# Patient Record
Sex: Male | Born: 1990 | Race: Black or African American | Hispanic: No | Marital: Single | Smoking: Current every day smoker
Health system: Southern US, Community
[De-identification: ages and names within clinical notes are randomized; demographics above are authoritative.]

## PROBLEM LIST (undated history)

## (undated) DIAGNOSIS — F209 Schizophrenia, unspecified: Secondary | ICD-10-CM

## (undated) HISTORY — DX: Schizophrenia, unspecified: F20.9

## (undated) HISTORY — PX: HYPOSPADIAS CORRECTION: SHX483

---
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
## 2010-05-21 NOTE — Assessment & Plan Note (Signed)
Summary: swollen gland in groin area,tcb   Vital Signs:  Patient profile:   20 year old male Height:      68.25 inches Weight:      135.7 pounds BMI:     20.56 Temp:     98.4 degrees F oral Pulse rate:   77 / minute BP sitting:   114 / 68  (left arm) Cuff size:   regular  Vitals Entered By: Gladstone Pih (May 21, 2009 4:12 PM) CC: C/O swollen in Groin area Is Patient Diabetic? No Pain Assessment Patient in pain? no        Primary Care Provider:  Lequita Asal  MD  CC:  C/O swollen in Groin area.  History of Present Illness: Right inguinal node swelling.  Concern is because he has had previous STD and because last fall he ad a "lymph node" removed from neck.  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  Otherwise feels fine.  No weight loss, night sweats, fevers or constitutional symptoms.  Also denies skin or GU symptoms    Smoker  Habits & Providers  Alcohol-Tobacco-Diet     Tobacco Status: current     Tobacco Counseling: to quit use of tobacco products     Cigarette Packs/Day: 0.5  Current Medications (verified): 1)  None  Allergies (verified): No Known Drug Allergies  Past History:  Past medical, surgical, family and social histories (including risk factors) reviewed, and no changes noted (except as noted below).  Past Medical History: Reviewed history from 06/16/2006 and no changes required. s/p hypospadias repair 8/92  Past Surgical History: Reviewed history from 06/28/2007 and no changes required. hypospadias repair  Family History: Reviewed history from 06/28/2007 and no changes required. brother with ADHD Mom and dad healthy  Social History: Reviewed history from 12/16/2008 and no changes required. Lives at home with mother and brother Rosana Fret (age 15).   A and B student. Likes working on cars and wants to be a Curator.  Also  plays basketball for fun. Smokes 1-2 cigarettes/day Intermittenty sexually active, always uses condoms No alcohol or illicit drug use Total sexual partners are 3-4.  Has sex with only females. Smoking Status:  current Packs/Day:  0.5  Physical Exam  General:  Well-developed,well-nourished,in no acute distress; alert,appropriate and cooperative throughout examination Genitalia:  Small rubbery 2-3 nodes in Rt groin.  Also has smaller nodes in left inguinal region   Impression & Recommendations:  Problem # 1:  LYMPHADENOPATHY (ICD-785.6) Doubt significant Orders: Comp Met-[facility] (16109-60454) CBC-[facility] (09811) [facility]- Est Level  3 (99213)  Problem # 2:  CONTACT WITH OR EXPOSURE TO VENEREAL DISEASES (ICD-V01.6) Recheck HIV and RPR Orders: RPR-[facility] (91478-29562) HIV-[facility] (13086-57846) [facility]- Est Level  3 (96295)  Problem # 3:  TOBACCO USER (ICD-305.1) Set quit date for next Monday. Orders: [facility]- Est Level  3 (28413)

## 2010-05-21 NOTE — Miscellaneous (Signed)
  Clinical Lists Changes  Problems: Removed problem of CONTACT WITH OR EXPOSURE TO VENEREAL DISEASES (ICD-V01.6) Removed problem of LYMPHADENOPATHY (ICD-785.6) Removed problem of Question of  DRUG ABUSE (ICD-305.90) Removed problem of GENITOURINARY INFECTION, CHLAMYDIA TRACHOMATIS (ICD-099.55)

## 2010-07-13 LAB — GC/CHLAMYDIA PROBE AMP, GENITAL
Chlamydia, DNA Probe: NEGATIVE
GC Probe Amp, Genital: NEGATIVE

## 2010-10-01 ENCOUNTER — Encounter: Payer: Self-pay | Admitting: Family Medicine

## 2010-10-01 ENCOUNTER — Ambulatory Visit (INDEPENDENT_AMBULATORY_CARE_PROVIDER_SITE_OTHER): Payer: Medicaid Other | Admitting: Family Medicine

## 2010-10-01 VITALS — BP 97/58 | HR 60 | Temp 98.8°F | Ht 68.75 in | Wt 147.0 lb

## 2010-10-01 DIAGNOSIS — F29 Unspecified psychosis not due to a substance or known physiological condition: Secondary | ICD-10-CM

## 2010-10-01 DIAGNOSIS — F209 Schizophrenia, unspecified: Secondary | ICD-10-CM

## 2010-10-01 NOTE — Patient Instructions (Signed)
Will draw fasting blood work. Make appointment for lab draw Nice to meet you

## 2010-10-01 NOTE — Progress Notes (Signed)
  Subjective:    Patient ID: Thomas Murphy, male    DOB: Dec 04, 1990, 20 y.o.   MRN: [id number]  HPI  Patient here to discuss workup for psychosis.  Brings written request from [place] from Manuela Neptune, NP and Carolanne Grumbling MD.  [address] [place] [phone] Fax [phone].  Prompted by recent diagnosis of psychosis.  Par mom, has noticed for past 3 years increasing bizarre behavior.  Laughing to himself, hearing things that arent around.  Received  a diagnosis of schizophrenia in May 2012 from [facility].  No recent SI, HI.  Still having some auditory hallucinations.  No sadness.  Is currently on Abilify.  Has follow-up appt in several weeks.  Continues to not be able to function independently, living with mother.   Completed part of a semester in college, not abel to hold down a job due to delusions.  I have reviewed patient's  PMH, FH, and Social history and Medications as related to this visit.   Review of Systems  Constitutional: Negative for fever and chills.  HENT: Negative for trouble swallowing.   Respiratory: Negative for cough and shortness of breath.   Cardiovascular: Negative for chest pain.  Gastrointestinal: Negative for nausea, abdominal pain, diarrhea and constipation.  Genitourinary: Negative for dysuria, discharge, penile swelling and genital sores.  Musculoskeletal: Negative for joint swelling.  Skin: Negative for rash.  Neurological: Negative for dizziness, weakness, numbness and headaches.  Psychiatric/Behavioral: Positive for hallucinations, behavioral problems and confusion. Negative for suicidal ideas and sleep disturbance. The patient is nervous/anxious.        Objective:   Physical Exam  Constitutional: He is oriented to person, place, and time. He appears well-developed and well-nourished. No distress.  HENT:  Head: Normocephalic and atraumatic.  Right Ear: External ear normal.  Left Ear: External ear normal.  Eyes: Conjunctivae and EOM are normal.  Pupils are equal, round, and reactive to light.  Neck: Neck supple. No thyromegaly present.  Cardiovascular: Normal rate and regular rhythm.   No murmur heard. Pulmonary/Chest: Effort normal and breath sounds normal. No respiratory distress. He has no wheezes.  Abdominal: Soft. Bowel sounds are normal. He exhibits no distension and no mass. There is no tenderness. There is no rebound and no guarding.  Musculoskeletal: He exhibits no edema.  Neurological: He is alert and oriented to person, place, and time. He has normal strength and normal reflexes. He displays no tremor and normal reflexes. No cranial nerve deficit. He exhibits normal muscle tone. He displays a negative Romberg sign. Coordination and gait normal.  Psychiatric: His affect is blunt. His speech is delayed. Hallucinating: unclear if responding to inernal stimuli. He expresses no suicidal plans and no homicidal plans.          Assessment & Plan:

## 2010-10-02 DIAGNOSIS — F209 Schizophrenia, unspecified: Secondary | ICD-10-CM | POA: Insufficient documentation

## 2010-10-02 DIAGNOSIS — F29 Unspecified psychosis not due to a substance or known physiological condition: Secondary | ICD-10-CM | POA: Insufficient documentation

## 2010-10-02 NOTE — Assessment & Plan Note (Addendum)
Psychiatry requests workup for psychosis.  Given several years in duration of abnormal behavior  with recent diagnosis of schizophrenia and a normal neuro exam unlikely to be a CNS or metabolic/ endocrine abnormality.  Will obtain TSH, CBC, CMET, U/A, UDS, Fasting labs (on Abilify)  If normal, no further workup needed.  Will follow-up annually or prn.

## 2010-10-02 NOTE — Assessment & Plan Note (Signed)
On Abilify.  Patient reports not having had any baseline labs.  Will draw FLP, fasting glucose, CMET, CBC.  Monitor weight.

## 2010-10-05 ENCOUNTER — Other Ambulatory Visit: Payer: Medicaid Other

## 2010-10-07 IMAGING — CT CT NECK W/ CM
3 of 4 series · 16 of 33 positions shown, 19 images · IV contrast (75CC OMNI 300)
Comparison: None

CLINICAL DATA: [DATE] years persistent left submandibular mass.

CT NECK WITH CONTRAST
TECHNIQUE: Multidetector CT imaging of the neck was performed with
intravenous contrast.
Contrast: 75 ml intravenous Nmnipaque-WNN.

[Series 3: soft tissue neck · axial · 0.49mm/px · z∈[-162,+48]mm · 8 of 72 slices shown, 10 images]
[im 8/72  soft-tissue]
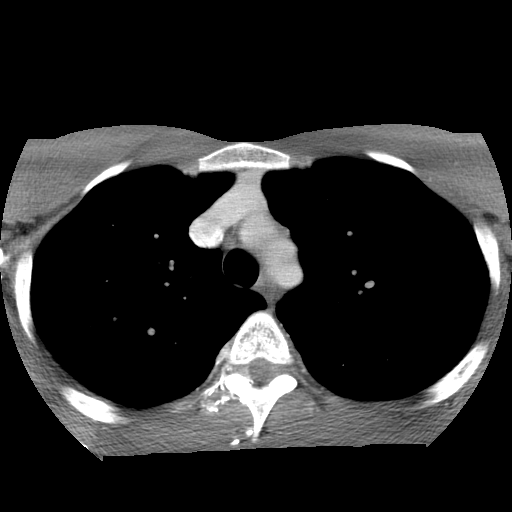
[im 8/72  bone]
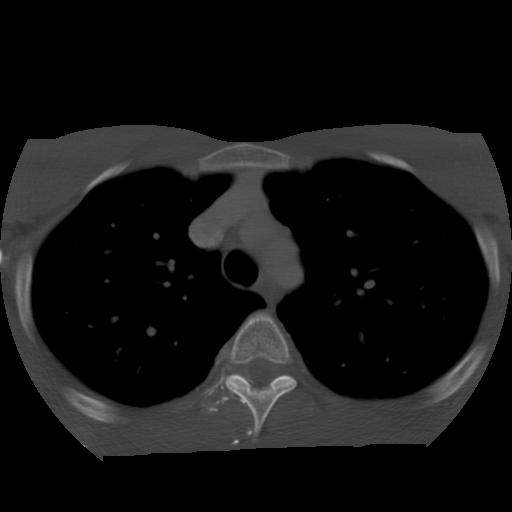
[im 15/72  bone]
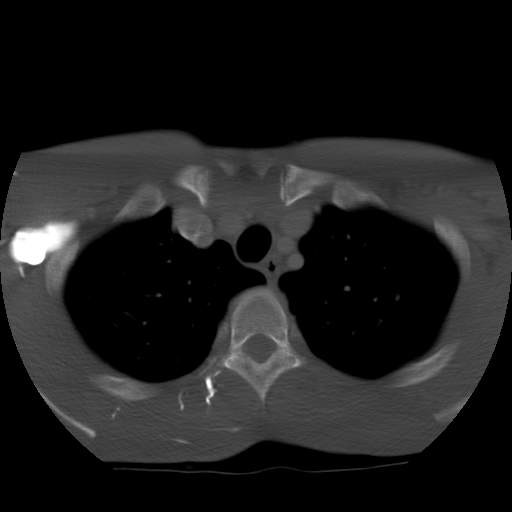
[im 22/72  bone]
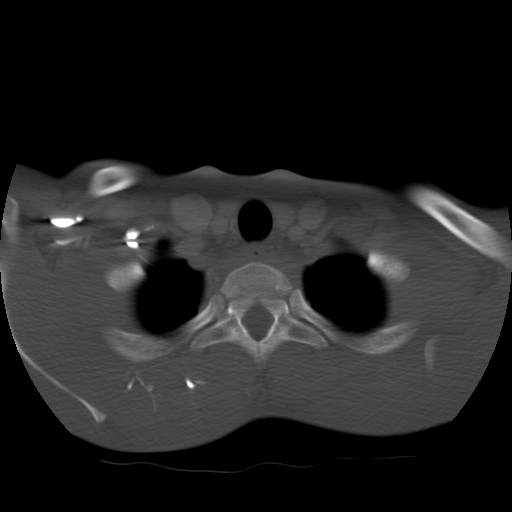
[im 29/72  bone]
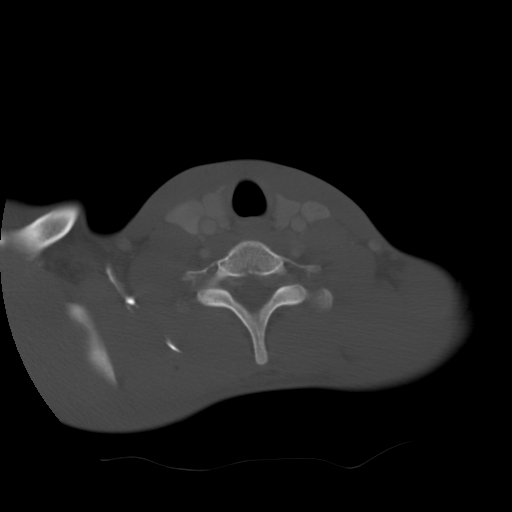
[im 43/72  soft-tissue]
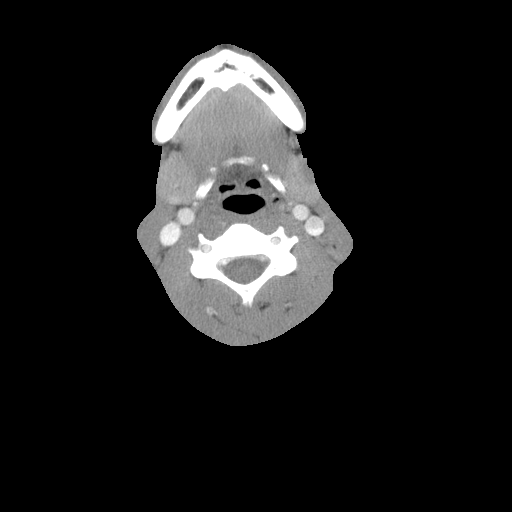
[im 43/72  bone]
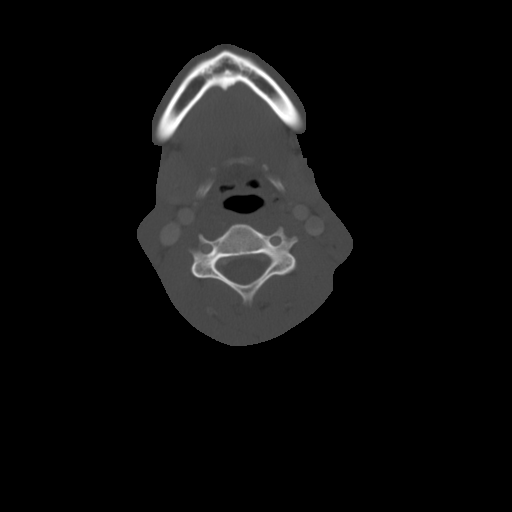
[im 50/72  bone]
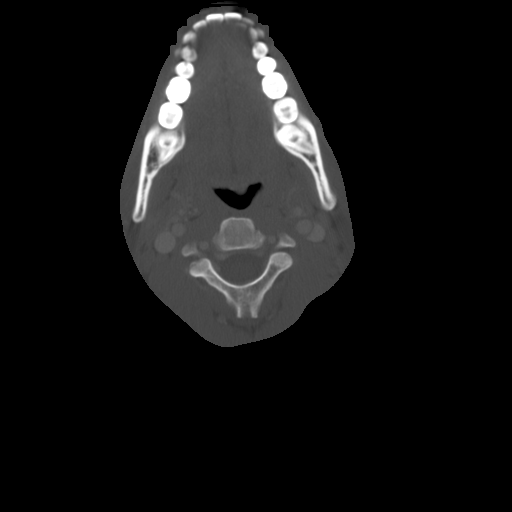
[im 57/72  bone]
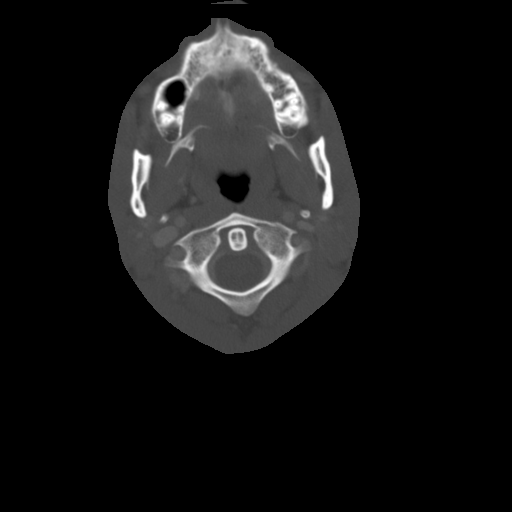
[im 64/72  bone]
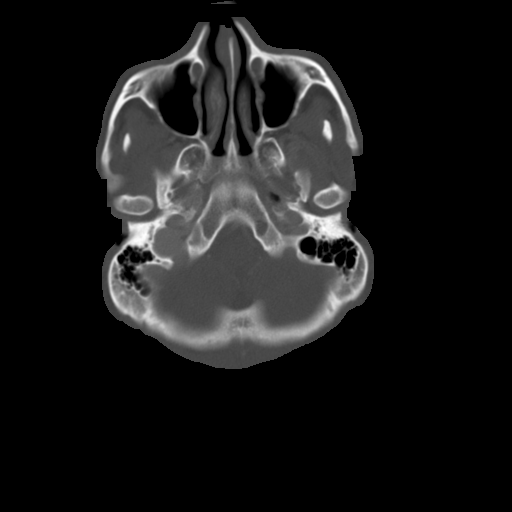

[Series 200: sag · sagittal · 0.53mm/px · 5 of 86 slices shown, 6 images]
[im 29/86  bone]
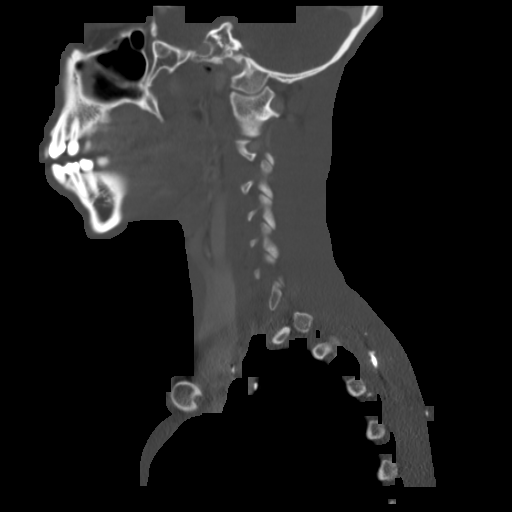
[im 36/86  bone]
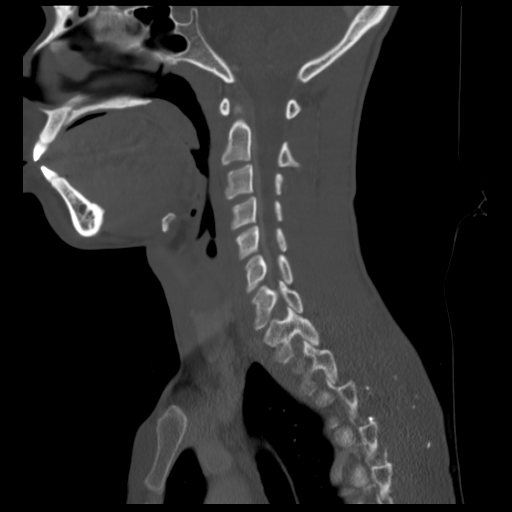
[im 43/86  soft-tissue]
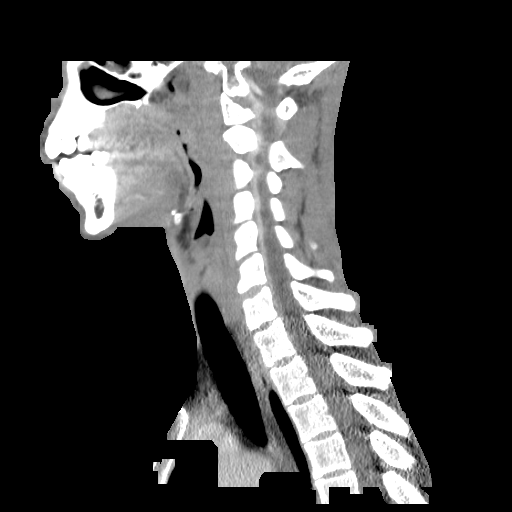
[im 43/86  bone]
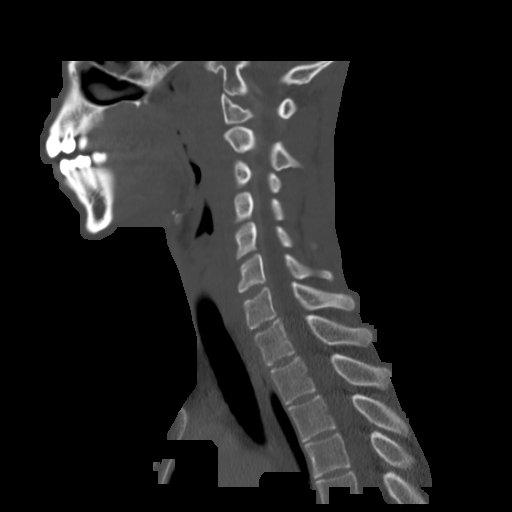
[im 50/86  bone]
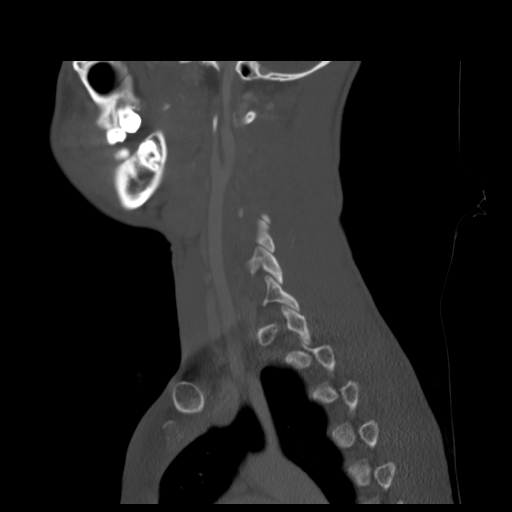
[im 57/86  bone]
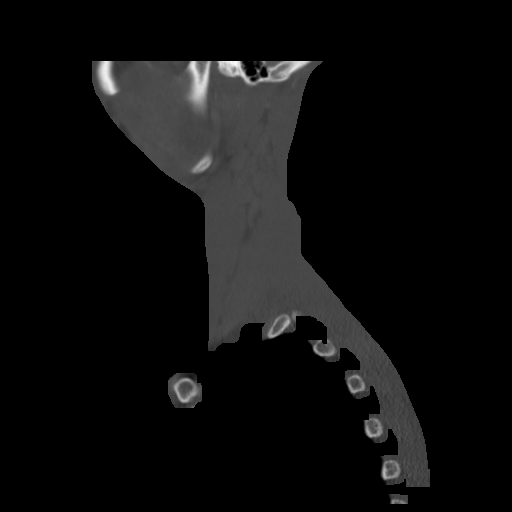

[Series 201: cor · coronal · 0.53mm/px · 3 of 79 slices shown]
[im 17/79  bone]
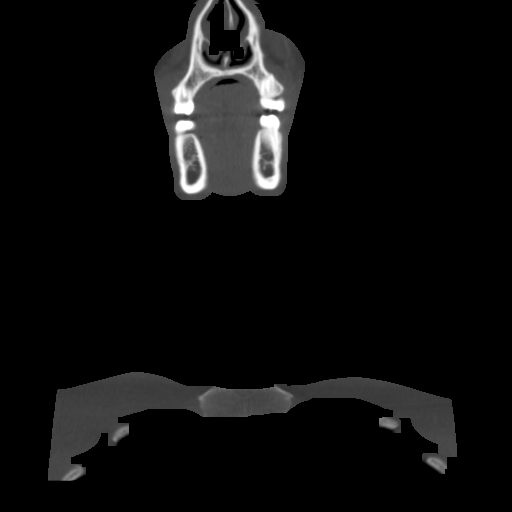
[im 32/79  bone]
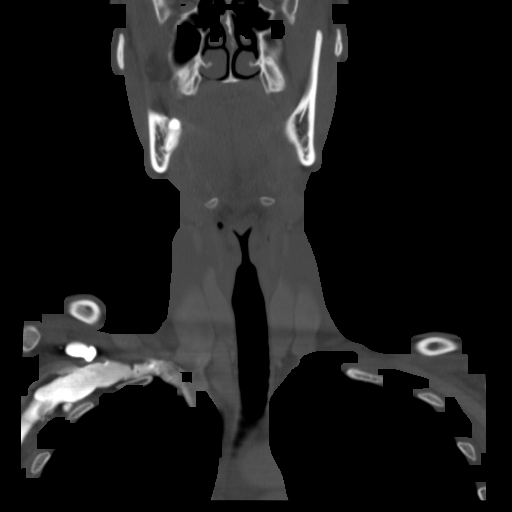
[im 47/79  bone]
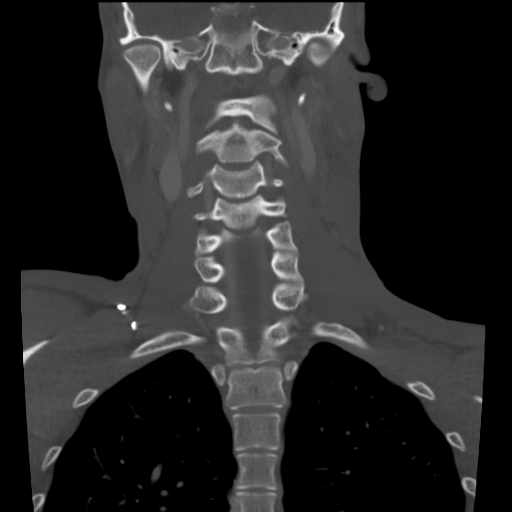

[16 of 33 positions shown; findings below may reference images not displayed]

FINDINGS: At the superior anterior left submandibular gland is
circumscribed low density cystic focus measuring 14 mm long
(sagittal image 53) X 14 mm AP X 14 mm wide (axial image 29).  No
associated calcification or infiltration of adjacent fat plane is
seen.  Increased number small bilateral (5mm short axis) posterior
triangle small lymph nodes seen consistent with borderline cervical
adenitis.  No other cervical mass or adenopathy seen.  Visualized
portions brain, orbits, remaining salivary glands, paranasal
sinuses, bilateral mastoid air cells and middle ear cavities,
nasopharynx, oropharynx, hypopharynx, larynx, thyroid and lung
apices appear normal.  Slight residual thymic tissue consistent
with the patient's age is seen with no superior mediastinal
mass/adenopathy noted.
IMPRESSION: 1.  Solitary circumscribed low density cystic focus anterior
superior left submandibular gland measuring 14 mm with no
associated calcification, enhancement, or calculus.  Differential
diagnosis favors a mucocele/cyst.
2.  Otherwise, no significant abnormality.

## 2010-10-09 ENCOUNTER — Other Ambulatory Visit (INDEPENDENT_AMBULATORY_CARE_PROVIDER_SITE_OTHER): Payer: Medicaid Other

## 2010-10-09 DIAGNOSIS — F29 Unspecified psychosis not due to a substance or known physiological condition: Secondary | ICD-10-CM

## 2010-10-09 DIAGNOSIS — F17200 Nicotine dependence, unspecified, uncomplicated: Secondary | ICD-10-CM

## 2010-10-09 DIAGNOSIS — F209 Schizophrenia, unspecified: Secondary | ICD-10-CM

## 2010-10-09 LAB — POCT URINALYSIS DIPSTICK
Bilirubin, UA: NEGATIVE
Blood, UA: NEGATIVE
Glucose, UA: NEGATIVE
Ketones, UA: NEGATIVE
Leukocytes, UA: NEGATIVE
Nitrite, UA: NEGATIVE
Protein, UA: NEGATIVE
Spec Grav, UA: 1.025
Urobilinogen, UA: 0.2
pH, UA: 6

## 2010-10-09 LAB — CBC WITH DIFFERENTIAL/PLATELET
Eosinophils Relative: 3 % (ref 0–5)
HCT: 40.1 % (ref 39.0–52.0)
Hemoglobin: 13.3 g/dL (ref 13.0–17.0)
Lymphocytes Relative: 31 % (ref 12–46)
Lymphs Abs: 2.2 10*3/uL (ref 0.7–4.0)
MCV: 97.3 fL (ref 78.0–100.0)
Monocytes Absolute: 0.6 10*3/uL (ref 0.1–1.0)
Neutro Abs: 3.9 10*3/uL (ref 1.7–7.7)
RBC: 4.12 MIL/uL — ABNORMAL LOW (ref 4.22–5.81)
WBC: 7 10*3/uL (ref 4.0–10.5)

## 2010-10-09 LAB — COMPREHENSIVE METABOLIC PANEL
ALT: 11 U/L (ref 0–53)
CO2: 29 mEq/L (ref 19–32)
Sodium: 139 mEq/L (ref 135–145)
Total Bilirubin: 0.6 mg/dL (ref 0.3–1.2)
Total Protein: 7 g/dL (ref 6.0–8.3)

## 2010-10-09 LAB — TSH: TSH: 0.695 u[IU]/mL (ref 0.350–4.500)

## 2010-10-09 NOTE — Progress Notes (Signed)
Labs done today Thomas Murphy 

## 2010-10-09 NOTE — Progress Notes (Signed)
LABS DRAWN M. HOLDER 

## 2010-10-10 LAB — DRUGS OF ABUSE SCREEN W/O ALC, ROUTINE URINE
Amphetamine Screen, Ur: NEGATIVE
Benzodiazepines.: NEGATIVE
Cocaine Metabolites: NEGATIVE
Marijuana Metabolite: POSITIVE — AB
Methadone: NEGATIVE
Phencyclidine (PCP): NEGATIVE

## 2010-10-10 LAB — HIV ANTIBODY (ROUTINE TESTING W REFLEX): HIV: NONREACTIVE

## 2010-10-10 LAB — LIPID PANEL
HDL: 45 mg/dL (ref >39)
LDL Cholesterol: 95 mg/dL (ref 0–99)

## 2010-10-15 ENCOUNTER — Encounter: Payer: Self-pay | Admitting: Family Medicine

## 2010-10-23 ENCOUNTER — Ambulatory Visit: Payer: Medicaid Other

## 2010-12-14 ENCOUNTER — Ambulatory Visit (INDEPENDENT_AMBULATORY_CARE_PROVIDER_SITE_OTHER): Payer: Medicaid Other | Admitting: Family Medicine

## 2010-12-14 VITALS — BP 108/69 | HR 58 | Temp 98.1°F | Wt 151.1 lb

## 2010-12-14 DIAGNOSIS — R300 Dysuria: Secondary | ICD-10-CM

## 2010-12-14 DIAGNOSIS — R369 Urethral discharge, unspecified: Secondary | ICD-10-CM

## 2010-12-14 NOTE — Assessment & Plan Note (Signed)
Will screen for GC/Chlam, HIV, and RPR.  Pt encouraged to return in 3-4 months for recheck for HIV.  Will call pt with results.

## 2010-12-14 NOTE — Patient Instructions (Signed)
I will call you with your results if you need any medication.  Otherwise, I will send you a letter with your results.   Always wear a condom!  No exceptions.    Consider smoking cessation-- Call and make an appointment with Dr. Raymondo Band if you would like to have a one on one smoking cessation class to look at medications that can help you quit.

## 2010-12-14 NOTE — Progress Notes (Signed)
  Subjective:    Patient ID: Thomas Murphy, male    DOB: 1990/07/31, 20 y.o.   MRN: [id number]  HPI Penile discharge- Yellow in color, x 2 weeks, no fever. + vomiting x 1, no nausea. No abd pain.  No rash.  No skin lesions.  States he has had only 1 sexual partner in past year.  Uses condoms approx 50% of the time.  Mother with pt here at appt.    Review of Systems As per above    Objective:   Physical Exam  Constitutional: He appears well-developed and well-nourished.  Cardiovascular: Normal rate.   Pulmonary/Chest: Effort normal. No respiratory distress.  Abdominal: Soft. He exhibits no distension. There is no tenderness. There is no rebound.  Genitourinary:       Urethra present on left side of head of penis (had surgery for hypospadias as newborn). + clear discharge. No lesions or rashes in groin or scrotal area.  No lesion present on penis.           Assessment & Plan:

## 2010-12-15 ENCOUNTER — Telehealth: Payer: Self-pay | Admitting: Family Medicine

## 2010-12-15 LAB — HIV ANTIBODY (ROUTINE TESTING W REFLEX): HIV: NONREACTIVE

## 2010-12-15 LAB — GC/CHLAMYDIA PROBE AMP, URINE
Chlamydia, Swab/Urine, PCR: NEGATIVE
GC Probe Amp, Urine: POSITIVE — AB

## 2010-12-15 NOTE — Telephone Encounter (Signed)
Calling for sons test results.

## 2010-12-15 NOTE — Telephone Encounter (Signed)
Spoke with mother and informed her that the HIV test was negative.

## 2010-12-16 ENCOUNTER — Telehealth: Payer: Self-pay | Admitting: Family Medicine

## 2010-12-16 NOTE — Telephone Encounter (Signed)
Mr. Luellen mom is calling to get his test results from Monday.  She asked how long it would take because he had the tests done on Monday and she hasn't heard anything yet.  I saw that she had gotten some results earlier so I told her that I would have the nurse call her.

## 2010-12-17 ENCOUNTER — Telehealth: Payer: Self-pay | Admitting: Family Medicine

## 2010-12-17 NOTE — Telephone Encounter (Signed)
Will send to [facility]

## 2010-12-17 NOTE — Telephone Encounter (Signed)
See pt phone note.

## 2010-12-17 NOTE — Telephone Encounter (Signed)
Patient was seen by Dr. Edmonia James and should have these results due to her ordering them

## 2010-12-17 NOTE — Telephone Encounter (Signed)
Pt had given me permission to call results to his mother.  Called mother today to let  Her know that pt needs to have antibiotic treatment for his gonorrhea infection.  Will give ceftriaxone 250mg  IM x 1.  Also will give azithromycin 1 gram po x 1 .  Pt will call today to my nurse appointment to receive antibiotic.

## 2010-12-18 ENCOUNTER — Encounter: Payer: Self-pay | Admitting: Family Medicine

## 2010-12-18 ENCOUNTER — Ambulatory Visit (INDEPENDENT_AMBULATORY_CARE_PROVIDER_SITE_OTHER): Payer: Medicaid Other | Admitting: *Deleted

## 2010-12-18 ENCOUNTER — Ambulatory Visit (INDEPENDENT_AMBULATORY_CARE_PROVIDER_SITE_OTHER): Payer: Medicaid Other | Admitting: Family Medicine

## 2010-12-18 ENCOUNTER — Other Ambulatory Visit: Payer: Self-pay | Admitting: Family Medicine

## 2010-12-18 VITALS — BP 94/62 | HR 71 | Temp 98.2°F | Ht 69.0 in | Wt 150.0 lb

## 2010-12-18 DIAGNOSIS — A5400 Gonococcal infection of lower genitourinary tract, unspecified: Secondary | ICD-10-CM

## 2010-12-18 DIAGNOSIS — H109 Unspecified conjunctivitis: Secondary | ICD-10-CM

## 2010-12-18 MED ORDER — CEFTRIAXONE SODIUM 1 G IJ SOLR
250.0000 mg | Freq: Once | INTRAMUSCULAR | Status: AC
  Administered 2010-12-18: 250 mg via INTRAMUSCULAR

## 2010-12-18 MED ORDER — AZITHROMYCIN 1 G PO PACK
1.0000 g | PACK | Freq: Once | ORAL | Status: AC
  Administered 2010-12-18: 1 g via ORAL

## 2010-12-18 NOTE — Progress Notes (Signed)
  Subjective:    Patient ID: Thomas Murphy, male    DOB: 28-May-1990, 20 y.o.   MRN: [id number]  HPI Right eye redness: X 2 days, watery discharge, + sensation of irritations.  No changes in vision.  No pus drainage.  No trauma to eye.  Pt is concerned that the gonorrhea has possibly spread to his eye.  Mother with pt at today's appointment.    Review of Systems As per above    Objective:   Physical Exam  Constitutional: He appears well-developed and well-nourished.  HENT:  Head: Normocephalic and atraumatic.  Eyes:       Right eye: + injected conjunctiva.  + watery discharge.  No pus.   Left eye: minimal redness. Minimal watery discharge. No pus.   Cardiovascular: Normal rate.   Pulmonary/Chest: Effort normal. No respiratory distress.  Skin: No rash noted.  Psychiatric: He has a normal mood and affect. His behavior is normal.          Assessment & Plan:

## 2010-12-18 NOTE — Assessment & Plan Note (Signed)
Most likely this is viral conjunctivitis- per findings on physical exam. There is some chance of self innoculation of gonorrhea.  Since urine positive last week for gonorrhea.  Was given ceftriaxone IM and azithromycin today.  Swab of eyelid performed- sent for GC/Chlam culture.

## 2010-12-18 NOTE — Progress Notes (Signed)
Patient in for treatment  of Gonorrhea in addition to office visit.. Patient advised to abstain from sex for 7 days , make sure partner is aware and has treatment and best practice to always use condoms.  Communicable Disease report faxed to [facility].

## 2010-12-18 NOTE — Progress Notes (Signed)
Patient was here for office visit with MD and recieved treatment for STD during this visit. This encounter  No charge

## 2010-12-22 ENCOUNTER — Encounter: Payer: Self-pay | Admitting: Family Medicine

## 2010-12-25 ENCOUNTER — Telehealth: Payer: Self-pay | Admitting: Family Medicine

## 2010-12-25 NOTE — Telephone Encounter (Signed)
Called to check on pt and to ensure that his eye is improving.  Mother answered phone and states that it is back to normal, no eye redness is present at this time.

## 2011-01-26 ENCOUNTER — Inpatient Hospital Stay (INDEPENDENT_AMBULATORY_CARE_PROVIDER_SITE_OTHER)
Admission: RE | Admit: 2011-01-26 | Discharge: 2011-01-26 | Disposition: A | Discharge status: Home / Self Care | Payer: Medicaid Other | Source: Ambulatory Visit | Attending: Emergency Medicine | Admitting: Emergency Medicine

## 2011-01-26 DIAGNOSIS — N342 Other urethritis: Secondary | ICD-10-CM

## 2011-01-26 DIAGNOSIS — R369 Urethral discharge, unspecified: Secondary | ICD-10-CM

## 2012-12-12 ENCOUNTER — Encounter: Payer: Medicaid Other | Admitting: Family Medicine

## 2014-03-20 ENCOUNTER — Inpatient Hospital Stay: Payer: Self-pay | Admitting: Psychiatry

## 2014-03-20 LAB — COMPREHENSIVE METABOLIC PANEL
ALK PHOS: 60 U/L
ANION GAP: 8 (ref 7–16)
AST: 16 U/L (ref 15–37)
Albumin: 4 g/dL (ref 3.4–5.0)
BUN: 10 mg/dL (ref 7–18)
Bilirubin,Total: 0.5 mg/dL (ref 0.2–1.0)
Calcium, Total: 9.3 mg/dL (ref 8.5–10.1)
Chloride: 103 mmol/L (ref 98–107)
Co2: 29 mmol/L (ref 21–32)
Creatinine: 1.08 mg/dL (ref 0.60–1.30)
GLUCOSE: 73 mg/dL (ref 65–99)
OSMOLALITY: 277 (ref 275–301)
Potassium: 3.6 mmol/L (ref 3.5–5.1)
SGPT (ALT): 24 U/L
Sodium: 140 mmol/L (ref 136–145)
Total Protein: 8.1 g/dL (ref 6.4–8.2)

## 2014-03-20 LAB — TSH: Thyroid Stimulating Horm: 2.49 u[IU]/mL

## 2014-03-20 LAB — CBC WITH DIFFERENTIAL/PLATELET
BASOS ABS: 0 10*3/uL (ref 0.0–0.1)
Basophil %: 0.3 %
EOS ABS: 0.2 10*3/uL (ref 0.0–0.7)
Eosinophil %: 2.3 %
HCT: 41.9 % (ref 40.0–52.0)
HGB: 13.8 g/dL (ref 13.0–18.0)
LYMPHS ABS: 2.1 10*3/uL (ref 1.0–3.6)
Lymphocyte %: 23.6 %
MCH: 33.6 pg (ref 26.0–34.0)
MCHC: 32.9 g/dL (ref 32.0–36.0)
MCV: 102 fL — AB (ref 80–100)
MONO ABS: 0.9 x10 3/mm (ref 0.2–1.0)
Monocyte %: 9.7 %
NEUTROS PCT: 64.1 %
Neutrophil #: 5.8 10*3/uL (ref 1.4–6.5)
PLATELETS: 182 10*3/uL (ref 150–440)
RBC: 4.11 10*6/uL — AB (ref 4.40–5.90)
RDW: 12 % (ref 11.5–14.5)
WBC: 9 10*3/uL (ref 3.8–10.6)

## 2014-03-21 LAB — URINALYSIS, COMPLETE
BACTERIA: NONE SEEN
BILIRUBIN, UR: NEGATIVE
BLOOD: NEGATIVE
GLUCOSE, UR: NEGATIVE mg/dL (ref 0–75)
Ketone: NEGATIVE
Nitrite: NEGATIVE
Ph: 6 (ref 4.5–8.0)
Protein: NEGATIVE
Specific Gravity: 1.011 (ref 1.003–1.030)
WBC UR: 2 /HPF (ref 0–5)

## 2014-03-21 LAB — DRUG SCREEN, URINE
Amphetamines, Ur Screen: NEGATIVE (ref ?–1000)
BENZODIAZEPINE, UR SCRN: NEGATIVE (ref ?–200)
Barbiturates, Ur Screen: NEGATIVE (ref ?–200)
CANNABINOID 50 NG, UR ~~LOC~~: POSITIVE (ref ?–50)
Cocaine Metabolite,Ur ~~LOC~~: NEGATIVE (ref ?–300)
MDMA (ECSTASY) UR SCREEN: NEGATIVE (ref ?–500)
METHADONE, UR SCREEN: NEGATIVE (ref ?–300)
Opiate, Ur Screen: NEGATIVE (ref ?–300)
PHENCYCLIDINE (PCP) UR S: NEGATIVE (ref ?–25)
TRICYCLIC, UR SCREEN: NEGATIVE (ref ?–1000)

## 2014-04-24 ENCOUNTER — Emergency Department (HOSPITAL_COMMUNITY)
Admission: EM | Admit: 2014-04-24 | Discharge: 2014-04-25 | Disposition: A | Discharge status: Other Acute Inpatient Hospital | Payer: Medicaid Other | Attending: Emergency Medicine | Admitting: Emergency Medicine

## 2014-04-24 ENCOUNTER — Encounter (HOSPITAL_COMMUNITY): Payer: Self-pay | Admitting: Family Medicine

## 2014-04-24 DIAGNOSIS — Z720 Tobacco use: Secondary | ICD-10-CM | POA: Insufficient documentation

## 2014-04-24 DIAGNOSIS — Z79899 Other long term (current) drug therapy: Secondary | ICD-10-CM | POA: Insufficient documentation

## 2014-04-24 DIAGNOSIS — Z9119 Patient's noncompliance with other medical treatment and regimen: Secondary | ICD-10-CM | POA: Insufficient documentation

## 2014-04-24 DIAGNOSIS — F209 Schizophrenia, unspecified: Secondary | ICD-10-CM | POA: Insufficient documentation

## 2014-04-24 LAB — CBC WITH DIFFERENTIAL/PLATELET
BASOS PCT: 0 % (ref 0–1)
Basophils Absolute: 0 10*3/uL (ref 0.0–0.1)
EOS ABS: 0.1 10*3/uL (ref 0.0–0.7)
Eosinophils Relative: 2 % (ref 0–5)
HEMATOCRIT: 41.9 % (ref 39.0–52.0)
Hemoglobin: 14.2 g/dL (ref 13.0–17.0)
Lymphocytes Relative: 29 % (ref 12–46)
Lymphs Abs: 2.7 10*3/uL (ref 0.7–4.0)
MCH: 33.1 pg (ref 26.0–34.0)
MCHC: 33.9 g/dL (ref 30.0–36.0)
MCV: 97.7 fL (ref 78.0–100.0)
MONO ABS: 1.3 10*3/uL — AB (ref 0.1–1.0)
Monocytes Relative: 14 % — ABNORMAL HIGH (ref 3–12)
NEUTROS ABS: 5.1 10*3/uL (ref 1.7–7.7)
Neutrophils Relative %: 55 % (ref 43–77)
PLATELETS: 215 10*3/uL (ref 150–400)
RBC: 4.29 MIL/uL (ref 4.22–5.81)
RDW: 11.7 % (ref 11.5–15.5)
WBC: 9.4 10*3/uL (ref 4.0–10.5)

## 2014-04-24 LAB — COMPREHENSIVE METABOLIC PANEL
ALBUMIN: 4.3 g/dL (ref 3.5–5.2)
ALK PHOS: 60 U/L (ref 39–117)
ALT: 16 U/L (ref 0–53)
ANION GAP: 9 (ref 5–15)
AST: 21 U/L (ref 0–37)
BUN: 11 mg/dL (ref 6–23)
CALCIUM: 9.4 mg/dL (ref 8.4–10.5)
CHLORIDE: 103 meq/L (ref 96–112)
CO2: 25 mmol/L (ref 19–32)
CREATININE: 0.93 mg/dL (ref 0.50–1.35)
GFR calc Af Amer: >90 mL/min (ref >90)
GLUCOSE: 90 mg/dL (ref 70–99)
POTASSIUM: 3.8 mmol/L (ref 3.5–5.1)
Sodium: 137 mmol/L (ref 135–145)
TOTAL PROTEIN: 7.5 g/dL (ref 6.0–8.3)
Total Bilirubin: 1.1 mg/dL (ref 0.3–1.2)

## 2014-04-24 LAB — ACETAMINOPHEN LEVEL: Acetaminophen (Tylenol), Serum: <10 ug/mL — ABNORMAL LOW (ref 10–30)

## 2014-04-24 LAB — ETHANOL: Alcohol, Ethyl (B): <5 mg/dL (ref 0–9)

## 2014-04-24 LAB — SALICYLATE LEVEL: Salicylate Lvl: <4 mg/dL (ref 2.8–20.0)

## 2014-04-24 MED ORDER — BENZTROPINE MESYLATE 1 MG PO TABS
0.5000 mg | ORAL_TABLET | Freq: Two times a day (BID) | ORAL | Status: DC
  Administered 2014-04-24: 0.5 mg via ORAL
  Filled 2014-04-24: qty 1

## 2014-04-24 MED ORDER — PALIPERIDONE ER 6 MG PO TB24
6.0000 mg | ORAL_TABLET | Freq: Every day | ORAL | Status: DC
  Filled 2014-04-24: qty 1

## 2014-04-24 MED ORDER — PALIPERIDONE ER 6 MG PO TB24
6.0000 mg | ORAL_TABLET | Freq: Every day | ORAL | Status: DC
  Administered 2014-04-24: 6 mg via ORAL
  Filled 2014-04-24 (×3): qty 1

## 2014-04-24 NOTE — ED Notes (Signed)
TTS IN PROGRESS 
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
## 2014-04-24 NOTE — BH Assessment (Signed)
Spoke with ED Provider Patience MuscaVictoria Creech,PA to obtain clinicals prior to assessing patient. Spoke with MHT, Gala Murdochanisha who is setting up tele-psych cart for assessment to begin.   Glorious PeachNajah Amante Fomby, MS, LCASA Assessment Counselor
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
## 2014-04-24 NOTE — ED Notes (Addendum)
Pt. Denies SI/HI. Pt. Denies any auditory/visual/tactile hallucinations when questioned. Pt. States that he always takes his medications and only stopped because he ran out. He states that when he left his [facility] at [facility] he felt much better. Now he believes his medications cause him to shake and does not like taking them, however, he states he knows he needs to take them. Pt. Calm/cooperative on assessment. Appropriate concentration and attention.

## 2014-04-24 NOTE — ED Notes (Signed)
[facility] TO CONFIRM [facility]
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
## 2014-04-24 NOTE — ED Notes (Addendum)
[facility]. Pt lives at home with mom and hasnt had meds x 1 week. Pt psych hx. Mom says he has been aggressive.

## 2014-04-24 NOTE — ED Notes (Signed)
PATIENT GIVEN PAPER SCRUBS TO CHANGE INTO

## 2014-04-24 NOTE — ED Notes (Signed)
BH HAS CALLED AND THEY ARE GOING TO COLLECT COLLATERAL INFORMATION FROM PT  MOTHER VIA PHONE AND WILL CALL BACK WITH DISPO

## 2014-04-24 NOTE — ED Provider Notes (Signed)
  Physical Exam  BP 110/61 mmHg  Pulse 56  Temp(Src) 98.5 F (36.9 C) (Oral)  Resp 16  Ht 5\' 10"  (1.778 m)  Wt 131 lb (59.421 kg)  BMI 18.80 kg/m2  SpO2 100%  Physical Exam  ED Course  Procedures  MDM  24 year old male with a history of schizophrenia presents off medications with concern of aggression towards his mother.  Patient is to be placed in inpatient treatment and is currently awaiting placement. He has medications and diet ordered.  There are no acute events during my shift.  Pt not currently IVCd, however may need IVC if he would like to leave per TTS.  Care transferred to Dr. Mora Bellmanni at 12AM.      Rhae LernerErin Elizabeth Shayanna Thatch, MD 04/25/14 0040  Merrie RoofJohn David Wofford III, MD 04/27/14 [phone]

## 2014-04-24 NOTE — BH Assessment (Deleted)
[facility] . Unable to reach TTS, left a message for Tom to have [facility].   Glorious PeachNajah Nehemyah Foushee, MS, LCASA Assessment Counselor

## 2014-04-25 ENCOUNTER — Inpatient Hospital Stay (HOSPITAL_COMMUNITY)
Admission: AD | Admit: 2014-04-25 | Discharge: 2014-04-29 | DRG: 885 | Disposition: A | Discharge status: Home / Self Care | Payer: Federal, State, Local not specified - Other | Source: Intra-hospital | Attending: Psychiatry | Admitting: Psychiatry

## 2014-04-25 ENCOUNTER — Encounter (HOSPITAL_COMMUNITY): Payer: Self-pay | Admitting: *Deleted

## 2014-04-25 DIAGNOSIS — F200 Paranoid schizophrenia: Principal | ICD-10-CM | POA: Diagnosis present

## 2014-04-25 DIAGNOSIS — Z634 Disappearance and death of family member: Secondary | ICD-10-CM

## 2014-04-25 DIAGNOSIS — F209 Schizophrenia, unspecified: Secondary | ICD-10-CM

## 2014-04-25 DIAGNOSIS — F17210 Nicotine dependence, cigarettes, uncomplicated: Secondary | ICD-10-CM | POA: Diagnosis present

## 2014-04-25 DIAGNOSIS — Z9119 Patient's noncompliance with other medical treatment and regimen: Secondary | ICD-10-CM

## 2014-04-25 DIAGNOSIS — F29 Unspecified psychosis not due to a substance or known physiological condition: Secondary | ICD-10-CM | POA: Diagnosis present

## 2014-04-25 DIAGNOSIS — Z9114 Patient's other noncompliance with medication regimen: Secondary | ICD-10-CM | POA: Diagnosis present

## 2014-04-25 MED ORDER — MAGNESIUM HYDROXIDE 400 MG/5ML PO SUSP: 30.0000 mL | Freq: Every day | ORAL | Status: DC | PRN

## 2014-04-25 MED ORDER — PALIPERIDONE ER 6 MG PO TB24
6.0000 mg | ORAL_TABLET | Freq: Every day | ORAL | Status: DC
  Administered 2014-04-25 – 2014-04-28 (×4): 6 mg via ORAL
  Filled 2014-04-25 (×2): qty 1
  Filled 2014-04-25: qty 14
  Filled 2014-04-25 (×3): qty 1

## 2014-04-25 MED ORDER — ACETAMINOPHEN 325 MG PO TABS: 650.0000 mg | ORAL_TABLET | Freq: Four times a day (QID) | ORAL | Status: DC | PRN

## 2014-04-25 MED ORDER — RISPERIDONE 2 MG PO TBDP: 2.0000 mg | ORAL_TABLET | Freq: Three times a day (TID) | ORAL | Status: DC | PRN

## 2014-04-25 MED ORDER — ALUM & MAG HYDROXIDE-SIMETH 200-200-20 MG/5ML PO SUSP: 30.0000 mL | ORAL | Status: DC | PRN

## 2014-04-25 MED ORDER — BENZTROPINE MESYLATE 0.5 MG PO TABS
0.5000 mg | ORAL_TABLET | Freq: Every day | ORAL | Status: DC
  Administered 2014-04-25 – 2014-04-28 (×4): 0.5 mg via ORAL
  Filled 2014-04-25 (×4): qty 1
  Filled 2014-04-25: qty 14
  Filled 2014-04-25: qty 1

## 2014-04-25 MED ORDER — LORAZEPAM 1 MG PO TABS: 1.0000 mg | ORAL_TABLET | ORAL | Status: DC | PRN

## 2014-04-25 MED ORDER — HYDROXYZINE HCL 25 MG PO TABS
25.0000 mg | ORAL_TABLET | ORAL | Status: DC | PRN
  Administered 2014-04-26: 25 mg via ORAL
  Filled 2014-04-25: qty 1

## 2014-04-25 MED ORDER — ZIPRASIDONE MESYLATE 20 MG IM SOLR: 20.0000 mg | INTRAMUSCULAR | Status: DC | PRN

## 2014-04-25 NOTE — ED Provider Notes (Signed)
[facility]   Purvis SheffieldForrest Sims Laday, MD 04/25/14 [phone]
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
## 2014-04-25 NOTE — Progress Notes (Signed)
ADMISSION NOTE: D: Patient is alert and oriented. Pt's mood and affect is pleasant and silly at times, otherwise appropriate to circumstance. Pt eye contact is brief. Pt observed looking at and reading RN identification badge frequently. Pt states he does not know why he's in the hospital. Pt reports his goal is "I'd like to sleep better" and denies any other additional goals for his stay [facility] at [facility]. Pt reports his current stressor is "people that try to take advantage of me like I don't know what I'm doing." Pt reports his mother and friend act like he does not know what he's doing. Pt reports hx of seizure when he "over heats"; reports last seizure "2 years ago." Pt unemployed and lives with his mother. Pt denies SI/HI and AVH at this time. Pt reports he has not had a bowel movement in "3 days" and states he usually has one daily but denies any discomfort. Pt remains resting in bed the majority of the afternoon. A: Pt oriented to unit. Pt offered nutrition. 15 minute checks initiated per protocol for patient safety.  R: Pt oriented to unit with ease. Pt cooperative and receptive to nursing interventions.

## 2014-04-25 NOTE — Tx Team (Addendum)
Initial Interdisciplinary Treatment Plan   PATIENT STRESSORS: Marital or family conflict   PATIENT STRENGTHS: Communication skills Physical Health Supportive family/friends   PROBLEM LIST: Problem List/Patient Goals Date to be addressed Date deferred Reason deferred Estimated date of resolution  Safety 04/25/2014     Medication management 04/25/2014     "I'd like to sleep better." 04/25/2014     (Pt denies having additional goals at this time.)      Paranoia  04/25/2014                              DISCHARGE CRITERIA:  Improved stabilization in mood, thinking, and/or behavior Verbal commitment to aftercare and medication compliance  PRELIMINARY DISCHARGE PLAN: Return to previous living arrangement  PATIENT/FAMIILY INVOLVEMENT: This treatment plan has been presented to and reviewed with the patient, Thomas Murphy.  The patient and family have been given the opportunity to ask questions and make suggestions.  Lendell CapriceGuthrie, Zylpha Poynor A 04/25/2014, 11:48 AM

## 2014-04-25 NOTE — [facility] Group Notes (Signed)
[facility] Group Notes:  (Counselor/Nursing/MHT/Case Management/Adjunct)  04/25/2014 1:15PM  Type of Therapy:  Group Therapy  Participation Level:  Active  Participation Quality:  Appropriate  Affect:  Flat  Cognitive:  Oriented  Insight:  Improving  Engagement in Group:  Limited  Engagement in Therapy:  Limited  Modes of Intervention:  Discussion, Exploration and Socialization  Summary of Progress/Problems: The topic for group was balance in life.  Pt participated in the discussion about when their life was in balance and out of balance and how this feels.  Pt discussed ways to get back in balance and short term goals they can work on to get where they want to be.  Belva CromeJaquan talked about feeling much more balanced today than yesterday.  "I got in an argument after I didn't take my meds yesterday.  Today I took my meds, and I'm much calmer and am better able to concentrate."  Stated that staying positive helps with balance as well.  Was attentive and engaged throughout, but with minimal verbal participation.   Daryel Geraldorth, Latravious Levitt B 04/25/2014 2:15 PM

## 2014-04-25 NOTE — ED Notes (Signed)
ALL VALUABLES AND BELONGINGS SENT WITH PATIENT TO [facility]

## 2014-04-25 NOTE — ED Notes (Signed)
[facility] HAS ARRIVED TO TRANSPORT 

## 2014-04-25 NOTE — ED Notes (Signed)
[facility] called to transport pt 

## 2014-04-25 NOTE — [facility] Suicide Risk Assessment (Signed)
   Nursing information obtained from:    Demographic factors:    Current Mental Status:    Loss Factors:    Historical Factors:    Risk Reduction Factors:    Total Time spent with patient: 45 minutes  CLINICAL FACTORS:   Unstable or Poor Therapeutic Relationship Previous Psychiatric Diagnoses and Treatments  Psychiatric Specialty Exam: Physical Exam  ROS  Blood pressure 107/55, pulse 82, temperature 98.7 F (37.1 C), temperature source Oral, resp. rate 16, height 5' 7.5" (1.715 m), weight 58.968 kg (130 lb).Body mass index is 20.05 kg/(m^2).        Please see H&P.                                         Sleep:       SUICIDE RISK:   Mild:  Suicidal ideation of limited frequency, intensity, duration, and specificity.  There are no identifiable plans, no associated intent, mild dysphoria and related symptoms, good self-control (both objective and subjective assessment), few other risk factors, and identifiable protective factors, including available and accessible social support.  PLAN OF CARE:Please see H&P.   I certify that inpatient services furnished can reasonably be expected to improve the patient's condition.  Zori Benbrook  MD  04/25/2014, 11:32 AM

## 2014-04-25 NOTE — ED Notes (Signed)
Patient has been accepted to behavioral health.

## 2014-04-26 LAB — LIPID PANEL
CHOL/HDL RATIO: 3.9 ratio
Cholesterol: 167 mg/dL (ref 0–200)
HDL: 43 mg/dL (ref >39)
LDL Cholesterol: 113 mg/dL — ABNORMAL HIGH (ref 0–99)
TRIGLYCERIDES: 57 mg/dL (ref <150)
VLDL: 11 mg/dL (ref 0–40)

## 2014-04-26 LAB — HEMOGLOBIN A1C
HEMOGLOBIN A1C: 4.5 % (ref <5.7)
Mean Plasma Glucose: 82 mg/dL (ref <117)

## 2014-04-26 LAB — TSH: TSH: 1.146 u[IU]/mL (ref 0.350–4.500)

## 2014-04-26 MED ORDER — HALOPERIDOL 5 MG PO TABS: 5.0000 mg | ORAL_TABLET | Freq: Three times a day (TID) | ORAL | Status: DC | PRN

## 2014-04-26 MED ORDER — PALIPERIDONE PALMITATE 156 MG/ML IM SUSP: 156.0000 mg | Freq: Once | INTRAMUSCULAR | Status: DC

## 2014-04-26 MED ORDER — PALIPERIDONE PALMITATE 234 MG/1.5ML IM SUSP
234.0000 mg | Freq: Once | INTRAMUSCULAR | Status: AC
  Administered 2014-04-29: 234 mg via INTRAMUSCULAR
  Filled 2014-04-26 (×2): qty 1.5

## 2014-04-26 MED ORDER — PALIPERIDONE PALMITATE 117 MG/0.75ML IM SUSP: 117.0000 mg | Freq: Once | INTRAMUSCULAR | Status: DC

## 2014-04-26 MED ORDER — BENZTROPINE MESYLATE 0.5 MG PO TABS: 0.5000 mg | ORAL_TABLET | Freq: Three times a day (TID) | ORAL | Status: DC | PRN
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
## 2014-04-26 NOTE — Plan of Care (Signed)
Problem: Alteration in thought process Goal: STG-Patient is able to follow short directions Outcome: Progressing Pt engaging in unit activities and able to follow direction

## 2014-04-26 NOTE — Progress Notes (Signed)
Patient ID: Thomas Murphy, male   DOB: 1991-03-10, 24 y.o.   MRN: [id number] D: Patient sitting in dayroom approach. Pt mood and affect appeared anxious. Pt reports goal is to stay positive and after discharge get back to school for automobile repairs. Pt denies SI/HI/AVH and pain. No acute distressed noted.   A: Medications administered as prescribed. Emotional support given and will continue to monitor pt's progress for stabilization.  R: Patient remains safe and complaint with medications. Pt socializing more today and attended evening group.

## 2014-04-26 NOTE — [facility] Group Notes (Signed)
[facility] LCSW Aftercare Discharge Planning Group Note   04/26/2014 10:27 AM  Participation Quality:  Appropriate   Mood/Affect:  Appropriate  Depression Rating:  0  Anxiety Rating:  2  Thoughts of Suicide:  No Will you contract for safety?   NA  Current AVH:  No  Plan for Discharge/Comments:  Pt reports that he had a good night's sleep and is feeling good this morning. No concerns noted. Pt reports that he plans to return home with mom and stepdad at discharge and [facility]. Valerie aware that we need appt scheduled for him prior to d/c. Pt to stay the weekend and will be assessed Monday.  Transportation Means: family member   Supports: mom, siblings  Counselling psychologistmart, OncologistHeather LCSWA

## 2014-04-26 NOTE — Progress Notes (Signed)
[facility] Group Notes:  (Nursing/MHT/Case Management/Adjunct)  Date:  04/26/2014  Time:  9:28 PM  Type of Therapy:  Psychoeducational Skills  Participation Level:  Minimal  Participation Quality:  Appropriate  Affect:  Blunted  Cognitive:  Appropriate  Insight:  Good  Engagement in Group:  Developing/Improving  Modes of Intervention:  Education  Summary of Progress/Problems: The patient shared in group this evening that he devoted much of his day to "staying positive". The patient's goals for tomorrow are as follows: try to stay positive and take all of his medication. As a theme for the day, his coping skill is to exercise.    Hazle CocaGOODMAN, Breya Cass S 04/26/2014, 9:28 PM

## 2014-04-26 NOTE — Plan of Care (Signed)
Problem: Alteration in thought process Goal: STG-Patient does not respond to command hallucinations Outcome: Progressing Pt has been sleeping most of the evening. When awake does not seem to be responding to internal stimuli.

## 2014-04-26 NOTE — Progress Notes (Signed)
Patient ID: Thomas GandyJaquan M Murphy, male   DOB: 1990-07-25, 24 y.o.   MRN: [id number] D: Patient in bed sleeping on approach. Pt alert and cooperative. Pt report not able to sleep in a couple days. Pt mood/affect is anxious and sad. Pt denies SI/HI/AVH and pain. No acute distressed noted.   A: Medications administered as prescribed. Emotional support given and will continue to monitor pt's progress for stabilization.  R: Patient remains safe and complaint with medications.

## 2014-04-26 NOTE — Progress Notes (Signed)
D: Patient denies SI/HI and auditory and visual hallucinations. Patient is having some delusional thought content about his stay here and about potentially taking medications, feeling that they will "kill him." Patient has an anxious mood and affect. Patient is guarded but is participating somewhat within the milieu.  A: Patient given emotional support from RN. Patient encouraged to come to staff with concerns and/or questions. Patient's medication routine continued. Patient's orders and plan of care reviewed.  R: Patient remains cooperative. Will continue to monitor patient q15 minutes for safety.

## 2014-04-27 NOTE — Plan of Care (Signed)
Problem: Ineffective individual coping Goal: STG-Increase in ability to manage activities of daily living Outcome: Progressing Patients clothing matches environment and patient appears to be groomed. Patient is able to manage ADL's.
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
## 2014-04-27 NOTE — Progress Notes (Signed)
Patient ID: Thomas Murphy, male   DOB: 1990-12-27, 24 y.o.   MRN: [id number]  The focus of this group is to educate the patient on the purpose and policies of crisis stabilization and provide a format to answer questions about their admission.  The group details unit policies and expectations of patients while admitted. Writer discussed filling out self inventory and the importance of it for patient's care.   Patient did not attend group.

## 2014-04-27 NOTE — Progress Notes (Signed)
Patient ID: Thomas Murphy, male   DOB: [birth year], 24 y.o.   MRN: [id number]  DAR: Pt. Denies SI/HI and A/V Hallucinations. Patient reports that he slept good, appetite is good, energy level is normal, and concentration is good. Patient reports his depression, anxiety, and hopelessness is 0. Patient does not report any pain or discomfort at this time. Patient at times can have an inappropriate smile otherwise affect is blunted. Support and encouragement provided to the patient. Patient had no scheduled medications during this shift and has not required any PRN's at this time. Patient reports his goal today is, "to prepare for my new medication." Patient reports he will meet this by staying, "relaxed and make sure I am calm."  Patient is cooperative but minimal with this Clinical research associatewriter. Patient did not attend RN groups this morning and is only seen in the milieu at times. Q15 minute checks are maintained for safety.

## 2014-04-27 NOTE — [facility] Group Notes (Signed)
Patient did not attend the RN group at 0930 hrs. 

## 2014-04-27 NOTE — Progress Notes (Signed)
The focus of this group is to help patients review their daily goal of treatment and discuss progress on daily workbooks. Pt did not attend the evening group. 

## 2014-04-28 NOTE — Plan of Care (Signed)
Problem: Alteration in thought process Goal: LTG-Patient verbalizes understanding importance med regimen (Patient verbalizes understanding of importance of medication regimen and need to continue outpatient care.)  Outcome: Progressing Patient asked about when he was getting Invega injection and is taking medications as prescribed see MAR

## 2014-04-28 NOTE — [facility] Group Notes (Signed)
[facility] LCSW Group Therapy  04/28/2014   10:00 AM   Type of Therapy:  Group Therapy  Participation Level:  Active  Participation Quality:  Appropriate and Attentive  Affect:  Appropriate, Bright - observed smiling throughout group  Cognitive:  Alert and Appropriate  Insight:  Developing/Improving and Engaged  Engagement in Therapy:  Developing/Improving and Engaged  Modes of Intervention:  Clarification, Confrontation, Discussion, Education, Exploration, Limit-setting, Orientation, Problem-solving, Rapport Building, Dance movement psychotherapisteality Testing, Socialization and Support  Summary of Progress/Problems: The main focus of today's process group was to identify the patient's current support system and decide on other supports that can be put in place.  An emphasis was placed on using counselor, doctor, therapy groups, 12-step groups, and problem-specific support groups to expand supports, as well as doing something different than has been done before. Pt states that his mom and brother are supportive.  Pt discussed what it means to be supportive to him, staying positive and listening to him.  Pt actively participated and was engaged in group discussion.     Thomas IvanChelsea Horton, LCSW 04/28/2014 12:30 PM

## 2014-04-28 NOTE — Plan of Care (Signed)
Problem: Ineffective individual coping Goal: STG: Patient will remain free from self harm Outcome: Progressing Patient has remained free from self harm.     

## 2014-04-28 NOTE — Progress Notes (Signed)
[facility] Group Notes:  (Nursing/MHT/Case Management/Adjunct)  Date:  04/28/2014  Time:  5:27 PM  Type of Therapy:  Psychoeducational Skills  Participation Level:  Active  Participation Quality:  Appropriate and Attentive  Affect:  Appropriate  Cognitive:  Appropriate  Insight:  Appropriate  Engagement in Group:  Engaged  Modes of Intervention:  Activity  Summary of Progress/Problems:  Romelia Bromell C 04/28/2014, 5:27 PM 

## 2014-04-28 NOTE — Progress Notes (Signed)
Patient ID: Thomas Murphy, male   DOB: 19-Feb-1991, 24 y.o.   MRN: [id number]  The focus of this group is to educate the patient on the purpose and policies of crisis stabilization and provide a format to answer questions about their admission.  The group details unit policies and expectations of patients while admitted. Writer discussed filling out self inventory and the importance of it for patient's care. Patient's were also asked to discuss how they were feeling this morning.  Patient attended group and appeared engaged. Patient reports he is "kinda tired" this morning. Patient filled out his self inventory sheet and asked when he was receiving his Invega injection.
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
## 2014-04-28 NOTE — Progress Notes (Deleted)
Patient ID: Thomas Murphy, male   DOB: [birth year], 24 y.o.   MRN: [id number] [facility] MD Progress Note  04/28/2014 3:26 PM Thomas Murphy  MRN:  [id number] Subjective: Patient is smiling and states 'I am OK." Objective : Patient seen and chart reviewed. Patient continues to be calm and cooperative ,however at times does have weak  thoughts about hurting others if they start irritating him.  He feels now that he is amenable to get injectables and scheduled to get the [facility] IM tomorrow.  Patient otherwise denies any SI/HI/AH/VH. Patient denies any side effects of medications. Per staff patient is compliant with medications.  Patient with minimal verbal participation in group activities.  Diagnosis:    DSM5: Primary Psychiatric Diagnosis: Schizophrenia ,multiple episodes ,currently in acute episode   Secondary Psychiatric Diagnosis: Noncompliance with treatment Bereavement,uncomplicated   Non Psychiatric Diagnosis: See PMH   Total Time spent with patient: 45 minutes   ADL's:  Intact  Sleep: Fair  Appetite:  Fair   Psychiatric Specialty Exam: Physical Exam  ROS  Blood pressure 116/69, pulse 67, temperature 97.9 F (36.6 C), temperature source Oral, resp. rate 20, height 5' 7.5" (1.715 m), weight 58.968 kg (130 lb).Body mass index is 20.05 kg/(m^2).  General Appearance: Fairly Groomed  Patent attorney::  Fair  SPEECH ;Normal rate ,minimal verbal participation over all  Volume:  Normal  Mood:  Anxious IMPROVING  Affect:  Congruent  Thought Process:  Linear  Orientation:  Full (Time, Place, and Person)  Thought Content:  Delusions and Paranoid Ideation about being in hospital as well as medications  Suicidal Thoughts:  No  Homicidal Thoughts:  No  Memory:  Immediate;   Fair Recent;   Fair Remote;   Fair  Judgement:  Impaired  Insight:  Shallow  Psychomotor Activity:  Normal  Concentration:  Fair  Recall:  [employer] of Knowledge:Fair  Language: Fair  Akathisia:   No  Handed:  Right  AIMS (if indicated):     Assets:  Physical Health  Sleep:  Number of Hours: 6.5   Musculoskeletal: Strength & Muscle Tone: within normal limits Gait & Station: normal Patient leans: N/A  Current Medications: Current Facility-Administered Medications  Medication Dose Route Frequency Provider Last Rate Last Dose  . acetaminophen (TYLENOL) tablet 650 mg  650 mg Oral Q6H PRN Jomarie Longs, MD      . alum & mag hydroxide-simeth (MAALOX/MYLANTA) 200-200-20 MG/5ML suspension 30 mL  30 mL Oral Q4H PRN Saramma Eappen, MD      . paliperidone (INVEGA) 24 hr tablet 6 mg  6 mg Oral QHS Saramma Eappen, MD   6 mg at 04/26/14 2143   And  . benztropine (COGENTIN) tablet 0.5 mg  0.5 mg Oral QHS Saramma Eappen, MD   0.5 mg at 04/26/14 2143  . haloperidol (HALDOL) tablet 5 mg  5 mg Oral Q8H PRN Jomarie Longs, MD       And  . benztropine (COGENTIN) tablet 0.5 mg  0.5 mg Oral Q8H PRN Jomarie Longs, MD      . hydrOXYzine (ATARAX/VISTARIL) tablet 25 mg  25 mg Oral Q4H PRN Jomarie Longs, MD   25 mg at 04/26/14 2143  . magnesium hydroxide (MILK OF MAGNESIA) suspension 30 mL  30 mL Oral Daily PRN Jomarie Longs, MD      . Melene Muller ON 06/03/2014] Paliperidone Palmitate SUSP 117 mg  117 mg Intramuscular Once Jomarie Longs, MD      . Melene Muller ON 05/06/2014] Paliperidone Palmitate SUSP  156 mg  156 mg Intramuscular Once Jomarie Longs, MD      . Melene Muller ON 04/29/2014] Paliperidone Palmitate SUSP 234 mg  234 mg Intramuscular Once Jomarie Longs, MD        Lab Results:  Results for orders placed or performed during the hospital encounter of 04/25/14 (from the past 48 hour(s))  Lipid panel     Status: Abnormal   Collection Time: 04/26/14  6:17 AM  Result Value Ref Range   Cholesterol 167 0 - 200 mg/dL   Triglycerides 57 <604 mg/dL   HDL 43 >54 mg/dL   Total CHOL/HDL Ratio 3.9 RATIO   VLDL 11 0 - 40 mg/dL   LDL Cholesterol 098 (H) 0 - 99 mg/dL    Comment:        Total Cholesterol/HDL:CHD  Risk Coronary Heart Disease Risk Table                     Men   Women  1/2 Average Risk   3.4   3.3  Average Risk       5.0   4.4  2 X Average Risk   9.6   7.1  3 X Average Risk  23.4   11.0        Use the calculated Patient Ratio above and the CHD Risk Table to determine the patient's CHD Risk.        ATP III CLASSIFICATION (LDL):  <100     mg/dL   Optimal  119-147  mg/dL   Near or Above                    Optimal  130-159  mg/dL   Borderline  829-562  mg/dL   High  >130     mg/dL   Very High Performed at [facility]   TSH     Status: None   Collection Time: 04/26/14  6:17 AM  Result Value Ref Range   TSH 1.146 0.350 - 4.500 uIU/mL    Comment: Performed at [facility]  Hemoglobin A1c     Status: None   Collection Time: 04/26/14  6:17 AM  Result Value Ref Range   Hgb A1c MFr Bld 4.5 <5.7 %    Comment: (NOTE)                                                                       According to the ADA Clinical Practice Recommendations for 2011, when HbA1c is used as a screening test:  >=6.5%   Diagnostic of Diabetes Mellitus           (if abnormal result is confirmed) 5.7-6.4%   Increased risk of developing Diabetes Mellitus References:Diagnosis and Classification of Diabetes Mellitus,Diabetes Care,2011,34(Suppl 1):S62-S69 and Standards of Medical Care in         Diabetes - 2011,Diabetes Care,2011,34 (Suppl 1):S11-S61.    Mean Plasma Glucose 82 <117 mg/dL    Comment: Performed at [facility]    Physical Findings: AIMS: Facial and Oral Movements Muscles of Facial Expression: None, normal Lips and Perioral Area: None, normal Jaw: None, normal Tongue: None, normal,Extremity Movements Upper (arms, wrists, hands, fingers): None, normal Lower (legs, knees, ankles, toes): None,  normal, Trunk Movements Neck, shoulders, hips: None, normal, Overall Severity Severity of abnormal movements (highest score from questions above): None,  normal Incapacitation due to abnormal movements: None, normal Patient's awareness of abnormal movements (rate only patient's report): No Awareness, Dental Status Current problems with teeth and/or dentures?: No Does patient usually wear dentures?: No  CIWA:  CIWA-Ar Total: 0 COWS:  COWS Total Score: 0  Treatment Plan Summary: Daily contact with patient to assess and evaluate symptoms and progress in treatment Medication management  Assessment: Patient is a 24 year old AAM ,who [facility] ,after patient was agitated ,paranoid ,when he was offered injectable antipsychotic medications. Patient continues to be paranoid ,but with improvement.   Plan:  Will continue Invega for ,since he reports good efficacy. Will plan on giving IM invega sustenna prior to discharge, if patient is agreeable. Reviewed collateral information obtaIned from mother -in EHR. Attempted to obtain collateral from mother -no response to calls. Will make prn medications available for agitation/anxiety .  Will continue to monitor vitals ,medication compliance and treatment side effects while patient is here.  Will monitor for medical issues as well as call consult as needed.  Reviewed labs -lipid panel is abnormal- will monitor. CSW will start working on disposition.  Patient to participate in therapeutic milieu .     Medical Decision Making Problem Points:  Established problem, stable/improving (1), Review of last therapy session (1) and Review of psycho-social stressors (1) Data Points:  Order Aims Assessment (2) Review of medication regiment & side effects (2) Review of new medications or change in dosage (2)  I certify that inpatient services furnished can reasonably be expected to improve the patient's condition.   Adonis BrookAGUSTIN, Jodel Mayhall MAY, AGNP-BC 04/27/2014, 3:26 PM

## 2014-04-28 NOTE — Progress Notes (Signed)
Patient ID: Thomas Murphy, male   DOB: 1990/04/21, 24 y.o.   MRN: [id number]  DAR: Pt. Denies SI/HI and A/V Hallucinations. Patient reports he slept good last night, appetite is fair, energy level is normal, and concentration level is good. Patient does not report any pain or discomfort at this time. Patient rates his depression, anxiety, and hopelessness at 0/10 for the day.  Support and encouragement provided to the patient. No scheduled medications were to be administered on this shift and no PRN medications were necessary for this patient's care. Patient is cooperative but minimal with staff. Patient is seen more in the milieu today and is attending groups today. Q15 minute checks are maintained for safety.

## 2014-04-28 NOTE — Progress Notes (Signed)
Writer has observed patient up in the dayroom watching tv with minimal interaction with peers. Writer spoke with him 1:1 and he reports having had a good day and is hopeful to discharge on tomorrow. He reports that he plans to stay on his medication after discharge. He denies si/hi/a/v hallucinations. Support and encouragement given, safety maintained on unit with 15 min checks.

## 2014-04-28 NOTE — [facility] Group Notes (Signed)
[facility] Group Notes:  (Nursing/MHT/Case Management/Adjunct)  Date:  04/28/2014  Time:  2:08 PM  Type of Therapy:  Psychoeducational Skills  Participation Level:  Minimal  Participation Quality:  Appropriate  Affect:  Blunted  Cognitive:  Appropriate  Insight:  Lacking  Engagement in Group:  Engaged  Modes of Intervention:  Discussion and Education  Summary of Progress/Problems: The purpose of this group is to discuss healthy support systems and different ways support systems can aid patients in recovery. Patient's also are asked to state a goal.   Patient reported that his goal for the day is to find out when he is receiving his Invega injection.   Thomas Murphy E 04/28/2014, 2:08 PM

## 2014-04-28 NOTE — Progress Notes (Signed)
Writer observed patient up in the dayroom watching tv with peers. Writer spoke with patient and informed him of scheduled medications and he is in agreement to take them later. He denies si/hi/a/v hallucinatons. He voiced no complaints and denies having pain, - si/hi/a/v hallucinations. Support and encouragement given, safety maintained on unit with 15 min checks.

## 2014-04-29 DIAGNOSIS — F200 Paranoid schizophrenia: Principal | ICD-10-CM

## 2014-04-29 MED ORDER — PALIPERIDONE PALMITATE 117 MG/0.75ML IM SUSP: 117.0000 mg | Freq: Once | INTRAMUSCULAR | Status: DC

## 2014-04-29 MED ORDER — PALIPERIDONE PALMITATE 156 MG/ML IM SUSP: 156.0000 mg | Freq: Once | INTRAMUSCULAR | Status: DC

## 2014-04-29 MED ORDER — PALIPERIDONE PALMITATE 156 MG/ML IM SUSP: 156.0000 mg | Freq: Once | INTRAMUSCULAR | Status: AC

## 2014-04-29 MED ORDER — BENZTROPINE MESYLATE 0.5 MG PO TABS: 0.5000 mg | ORAL_TABLET | Freq: Every day | ORAL | Status: AC

## 2014-04-29 MED ORDER — PALIPERIDONE ER 6 MG PO TB24: 6.0000 mg | ORAL_TABLET | Freq: Every day | ORAL | Status: DC

## 2014-04-29 MED ORDER — BENZTROPINE MESYLATE 0.5 MG PO TABS: 0.5000 mg | ORAL_TABLET | Freq: Every day | ORAL | Status: DC

## 2014-04-29 NOTE — Progress Notes (Signed)
Patient ID: Thomas Murphy, male   DOB: 01/13/1991, 24 y.o.   MRN: [id number] D: Patient appears calm and cooperative, denies SI/HI/AVH and pain at this time.   A: All personal items in locker returned to patient.  Patient  provided with discharge instructions and verbalized understanding by previous nurse. Medication samples/prescriptions given to pt.  R: Patient states he will comply with OP services and medications as prescribed. Pt refuse evening medication. Patient escorted to lobby to his mother.

## 2014-04-29 NOTE — [facility] Group Notes (Signed)
[facility] Note   04/29/2014 9:43 AM  Participation Quality:  Appropriate   Mood/Affect:  Appropriate  Depression Rating:  0  Anxiety Rating:  0  Thoughts of Suicide:  No Will you contract for safety?   NA  Current AVH:  No  Plan for Discharge/Comments:  Pt reports that he will return home with his mother at d/c. [facility]-he is aware that he must go next Mon at 8am [place]. Pt reports good sleep and appetite.  Transportation Means: mother  Supports: Gaffermother/family   Smart, OncologistHeather LCSWA

## 2014-04-29 NOTE — Progress Notes (Signed)
Pt will be discharged around 1000 pm after his mother gets off of work.  He denied any depression, anxiety or hopelessness on his self-inventory and denied any S/H ideation or A/V/H. He does appear to be responding to internal stimuli for he is noted to be nodding and smiling while not engaged with anyone.

## 2014-04-29 NOTE — Plan of Care (Signed)
Problem: Alteration in thought process Goal: LTG-Patient behavior demonstrates decreased signs psychosis Pt denies SI/HI/AVH and presents as oriented with organized thought process. Pt presents as pleasant and cooperative. No paranoia/delusions observed with pt today. He appears to be presenting at his baseline. Goal met. [place], [place] 04/26/2014 8:31 AM  Outcome: Completed/Met Date Met:  04/29/14 Pt calm and cooperate denies A/VH.
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
## 2014-04-29 NOTE — [facility] Suicide Risk Assessment (Signed)
   Demographic Factors:  Male  Total Time spent with patient: 45 minutes  Psychiatric Specialty Exam: Physical Exam  ROS  Blood pressure 119/69, pulse 74, temperature 97.9 F (36.6 C), temperature source Oral, resp. rate 20, height 5' 7.5" (1.715 m), weight 58.968 kg (130 lb).Body mass index is 20.05 kg/(m^2).  General Appearance: Casual  Eye Contact::  Fair  Speech:  Clear and Coherent  Volume:  Normal  Mood:  Euthymic  Affect:  Appropriate  Thought Process:  Coherent  Orientation:  Full (Time, Place, and Person)  Thought Content:  WDL  Suicidal Thoughts:  No  Homicidal Thoughts:  No  Memory:  Immediate;   Fair Recent;   Fair Remote;   Fair  Judgement:  Fair  Insight:  Fair  Psychomotor Activity:  Normal  Concentration:  Fair  Recall:  FiservFair  Fund of Knowledge:Fair  Language: Fair  Akathisia:  No  Handed:  Right  AIMS (if indicated):     Assets:  Desire for Improvement  Sleep:  Number of Hours: 5.75    Musculoskeletal: Strength & Muscle Tone: within normal limits Gait & Station: normal Patient leans: N/A   Mental Status Per Nursing Assessment::   On Admission:  NA  Current Mental Status by Physician: Patient denies any SI/HI/AH/VH  Loss Factors: NA  Historical Factors: Impulsivity  Risk Reduction Factors:   Living with another person, especially a relative and Positive social support  Continued Clinical Symptoms:  Previous Psychiatric Diagnoses and Treatments  Cognitive Features That Contribute To Risk:  Polarized thinking    Suicide Risk:  Minimal: No identifiable suicidal ideation.  Patients presenting with no risk factors but with morbid ruminations; may be classified as minimal risk based on the severity of the depressive symptoms  Discharge Diagnoses:  Primary Psychiatric Diagnosis: Schizophrenia ,multiple episodes ,currently in acute episode (resolved)  Secondary Psychiatric Diagnosis: Noncompliance with  treatment Bereavement,uncomplicated  Non Psychiatric Diagnosis: See PMH  Past Medical History  Diagnosis Date  . Schizophrenia      Plan Of Care/Follow-up recommendations: Please see DC instructions.   Activity:  NO RESTRICTIONS Diet:  REGULAR  Is patient on multiple antipsychotic therapies at discharge:  No   Has Patient had three or more failed trials of antipsychotic monotherapy by history:  No  Recommended Plan for Multiple Antipsychotic Therapies: NA    Honora Searson md 04/29/2014, 8:43 AM

## 2014-04-29 NOTE — Progress Notes (Signed)
Adult Psychoeducational Group Note  Date:  04/29/2014 Time:  11:19 AM  Group Topic/Focus:  Wellness Toolbox:   The focus of this group is to discuss various aspects of wellness, balancing those aspects and exploring ways to increase the ability to experience wellness.  Patients will create a wellness toolbox for use upon discharge.  Participation Level:  Active  Participation Quality:  Appropriate  Affect:  Appropriate  Cognitive:  Appropriate  Insight: Appropriate  Engagement in Group:  Engaged  Modes of Intervention:  Education and Support  Additional Comments:  Pt participated in group. Pt's goal is to stay positive around negative people.  Marquis Lunchbrahim, Phuong Hillary 04/29/2014, 11:19 AM

## 2014-04-29 NOTE — [facility] Suicide Risk Assessment (Signed)
[facility] INPATIENT:  Family/Significant Other Suicide Prevention Education  Suicide Prevention Education:  Education Completed; Thomas Murphy (pt's mother) [phone] has been identified by the patient as the family member/significant other with whom the patient will be residing, and identified as the person(s) who will aid the patient in the event of a mental health crisis (suicidal ideations/suicide attempt).  With written consent from the patient, the family member/significant other has been provided the following suicide prevention education, prior to the and/or following the discharge of the patient.  The suicide prevention education provided includes the following:  Suicide risk factors  Suicide prevention and interventions  National Suicide Hotline telephone number  [facility] assessment telephone number  [facility] Emergency Assistance 911  [facility] and/or Residential Mobile Crisis Unit telephone number  Request made of family/significant other to:  Remove weapons (e.g., guns, rifles, knives), all items previously/currently identified as safety concern.    Remove drugs/medications (over-the-counter, prescriptions, illicit drugs), all items previously/currently identified as a safety concern.  The family member/significant other verbalizes understanding of the suicide prevention education information provided.  The family member/significant other agrees to remove the items of safety concern listed above.  Thomas Murphy, Thomas Murphy LCSWA  04/29/2014, 10:22 AM

## 2014-04-29 NOTE — Progress Notes (Addendum)
[facility] Adult [facility] Management Discharge Plan :  Will you be returning to the same living situation after discharge: Yes,  home with mom At discharge, do you have transportation home?:Yes,  mom coming promptly at 1pm to pick up pt. Do you have the ability to pay for your medications:Yes,  mental health  Release of information consent forms completed and submitted to Medical Records by CSW.   Patient to Follow up at: Follow-up Information    Follow up with [facility] On 05/06/2014.   Why:  Arrive by 8am on this date for Invega shot and hospital follow-up/medication management/assessment for therapy services if interested.   Contact information:   [address] [place], [place] [address] Phone: [phone] Fax: [phone]      Patient denies SI/HI:   Yes,  during group/self report.     Safety Planning and Suicide Prevention discussed:  Yes,  SPE completed with pt's mother. SPI pamphlet provided to pt and he was encouraged to share information with support network, ask questions, and talk about any concerns relating to SPE.  Patient refused referral  Smart, Lebron QuamHeather LCSWA  04/29/2014, 10:56 AM   PLEASE SEE UPDATED AVS WITH APPTS!!!  The Sherwin-WilliamsHeather Smart, LCSWA 04/29/2014 11:16 AM

## 2014-05-01 NOTE — Progress Notes (Signed)
Patient Discharge Instructions:  After Visit Summary (AVS):   Faxed to:  05/01/14 Discharge Summary Note:   Faxed to:  05/01/14 Psychiatric Admission Assessment Note:   Faxed to:  05/01/14 Suicide Risk Assessment - Discharge Assessment:   Faxed to:  05/01/14 Faxed/Sent to the Next Level Care provider:  05/01/14 [facility] to [facility] @ [phone]  Jerelene ReddenSheena E Robins, 05/01/2014, 3:50 PM
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
## 2015-03-06 ENCOUNTER — Emergency Department (HOSPITAL_COMMUNITY)
Admission: EM | Admit: 2015-03-06 | Discharge: 2015-03-06 | Disposition: A | Discharge status: Home / Self Care | Payer: Medicaid Other | Attending: Emergency Medicine | Admitting: Emergency Medicine

## 2015-03-06 ENCOUNTER — Encounter (HOSPITAL_COMMUNITY): Payer: Self-pay | Admitting: Emergency Medicine

## 2015-03-06 DIAGNOSIS — H9202 Otalgia, left ear: Secondary | ICD-10-CM | POA: Insufficient documentation

## 2015-03-06 DIAGNOSIS — H6123 Impacted cerumen, bilateral: Secondary | ICD-10-CM | POA: Insufficient documentation

## 2015-03-06 DIAGNOSIS — F17210 Nicotine dependence, cigarettes, uncomplicated: Secondary | ICD-10-CM | POA: Insufficient documentation

## 2015-03-06 DIAGNOSIS — Z79899 Other long term (current) drug therapy: Secondary | ICD-10-CM | POA: Insufficient documentation

## 2015-03-06 DIAGNOSIS — F209 Schizophrenia, unspecified: Secondary | ICD-10-CM | POA: Insufficient documentation

## 2015-03-06 MED ORDER — IBUPROFEN 400 MG PO TABS: 400.0000 mg | ORAL_TABLET | Freq: Four times a day (QID) | ORAL | Status: DC | PRN

## 2015-03-06 NOTE — Discharge Instructions (Signed)
Avoid using Qtip to clean ear.  Apply water mixed with hydrogen peroxide solution into both ears several times weekly to help break down ear wax.  Return if your ear pain persists despite taking ibuprofen, or if you develop any hearing changes.  Earache An earache, also called otalgia, can be caused by many things. Pain from an earache can be sharp, dull, or burning. The pain may be temporary or constant. Earaches can be caused by problems with the ear, such as infection in either the middle ear or the ear canal, injury, impacted ear wax, middle ear pressure, or a foreign body in the ear. Ear pain can also result from problems in other areas. This is called referred pain. For example, pain can come from a sore throat, a tooth infection, or problems with the jaw or the joint between the jaw and the skull (temporomandibular joint, or TMJ). The cause of an earache is not always easy to identify. Watchful waiting may be appropriate for some earaches until a clear cause of the pain can be found. HOME CARE INSTRUCTIONS Watch your condition for any changes. The following actions may help to lessen any discomfort that you are feeling:  Take medicines only as directed by your health care provider. This includes ear drops.  Apply ice to your outer ear to help reduce pain.  Put ice in a plastic bag.  Place a towel between your skin and the bag.  Leave the ice on for 20 minutes, 2-3 times per day.  Do not put anything in your ear other than medicine that is prescribed by your health care provider.  Try resting in an upright position instead of lying down. This may help to reduce pressure in the middle ear and relieve pain.  Chew gum if it helps to relieve your ear pain.  Control any allergies that you have.  Keep all follow-up visits as directed by your health care provider. This is important. SEEK MEDICAL CARE IF:  Your pain does not improve within 2 days.  You have a fever.  You have new or  worsening symptoms. SEEK IMMEDIATE MEDICAL CARE IF:  You have a severe headache.  You have a stiff neck.  You have difficulty swallowing.  You have redness or swelling behind your ear.  You have drainage from your ear.  You have hearing loss.  You feel dizzy.   This information is not intended to replace advice given to you by your health care provider. Make sure you discuss any questions you have with your health care provider.   Document Released: 11/21/2003 Document Revised: 04/26/2014 Document Reviewed: 11/04/2013 Elsevier Interactive Patient Education [employer]2016 Elsevier Inc.

## 2015-03-06 NOTE — ED Provider Notes (Signed)
CSN: [id number]     Arrival date & time 03/06/15  0544 History   First MD Initiated Contact with Patient 03/06/15 [phone]     Chief Complaint  Patient presents with  . Otalgia     (Consider location/radiation/quality/duration/timing/severity/associated sxs/prior Treatment) HPI   24 year old male with history of schizophrenia who presents for evaluation of ear pain. Patient reports gradual onset of achy left ear pain ongoing for the past 3-4 days. Pain is waxing waning and not improving. Pain is mild to moderate in severity. He denies any associated fever, runny nose, sneezing, coughing, sore throat, dental pain, neck pain, drainage from the ear, tinnitus, or pain when laying on the affected ear. He denies any specific treatment tried. He does use Q-tips on occasion. He denies any injury to his ear. He also denies any hearing changes.  Past Medical History  Diagnosis Date  . Schizophrenia [facility](HCC)    Past Surgical History  Procedure Laterality Date  . Hypospadias correction     Family History  Problem Relation Age of Onset  . Bipolar disorder Sister   . ADD / ADHD Brother   . Drug abuse Maternal Uncle   . Cancer Maternal Grandmother 1665    breast ca  . Stroke Neg Hx   . Heart disease Neg Hx   . Bipolar disorder Brother    Social History  Substance Use Topics  . Smoking status: Current Every Day Smoker -- 0.50 packs/day for 6 years    Types: Cigarettes, Cigars  . Smokeless tobacco: None  . Alcohol Use: No    Review of Systems  Constitutional: Negative for fever.  HENT: Positive for ear pain. Negative for ear discharge.   Skin: Negative for rash.      Allergies  Ace inhibitors  Home Medications   Prior to Admission medications   Medication Sig Start Date End Date Taking? Authorizing Provider  benztropine (COGENTIN) 0.5 MG tablet Take 1 tablet (0.5 mg total) by mouth at bedtime. 04/29/14   Thermon LeylandLaura A Davis, NP  paliperidone (INVEGA) 6 MG 24 hr tablet Take 1 tablet (6 mg  total) by mouth at bedtime. 04/29/14   Thermon LeylandLaura A Davis, NP  Paliperidone Palmitate (INVEGA SUSTENNA) 117 MG/0.75ML SUSP Inject 117 mg into the muscle once. 06/03/14   Thermon LeylandLaura A Davis, NP  Paliperidone Palmitate (INVEGA SUSTENNA) 156 MG/ML SUSP Inject 1 mL (156 mg total) into the muscle once. 05/06/14   Thermon LeylandLaura A Davis, NP   BP 111/64 mmHg  Pulse 79  Temp(Src) 97.7 F (36.5 C) (Oral)  Resp 18  Ht 5\' 10"  (1.778 m)  Wt 149 lb (67.586 kg)  BMI 21.38 kg/m2  SpO2 100% Physical Exam  Constitutional: He appears well-developed and well-nourished. No distress.  HENT:  Head: Atraumatic.  Nose: Nose normal.  Mouth/Throat: Oropharynx is clear and moist. No oropharyngeal exudate.  Right ear: Cerumen impaction unable to visualized TM, no pain with manipulation of the earlobe Left ear: Mild cerumen impaction however TM is visualized and does not appear infected. Normal ear canal. No pain with manipulation of the earlobe.  Eyes: Conjunctivae are normal.  Neck: Normal range of motion. Neck supple.  Neurological: He is alert.  Skin: No rash noted.  Psychiatric: He has a normal mood and affect.  Nursing note and vitals reviewed.   ED Course  Procedures (including critical care time)  Patient presents with left ear pain. No evidence to suggest otitis media on exam. No evidence to suggest otitis externa on exam. No TMJ  or evidence to suggest mastoiditis.  Patient does use Q-tips to clean his ear and it appears he has some cerumen impaction. I recommend ear irrigation with water and peroxide solution. Recommend ibuprofen as needed for ear pain. Return precaution discussed. ENT referral given as needed.  Labs Review Labs Reviewed - No data to display  Imaging Review No results found. I have personally reviewed and evaluated these images and lab results as part of my medical decision-making.   EKG Interpretation None      MDM   Final diagnoses:  Left ear pain    BP 111/64 mmHg  Pulse 79   Temp(Src) 97.7 F (36.5 C) (Oral)  Resp 18  Ht  (1.778 m)  Wt 149 lb (67.586 kg)  BMI 21.38 kg/m2  SpO2 100%      Fayrene Helper, PA-C 03/06/15 1914  Alvira Monday, MD 03/09/15 1925

## 2015-03-06 NOTE — ED Notes (Signed)
Pt c/o L ear pain x 3-4 days. Denies fever

## 2016-09-07 ENCOUNTER — Ambulatory Visit (HOSPITAL_COMMUNITY)
Admission: EM | Admit: 2016-09-07 | Discharge: 2016-09-07 | Disposition: A | Discharge status: Home / Self Care | Payer: Medicaid Other | Attending: Family Medicine | Admitting: Family Medicine

## 2016-09-07 ENCOUNTER — Encounter (HOSPITAL_COMMUNITY): Payer: Self-pay | Admitting: Emergency Medicine

## 2016-09-07 DIAGNOSIS — Q549 Hypospadias, unspecified: Secondary | ICD-10-CM | POA: Diagnosis not present

## 2016-09-07 NOTE — ED Provider Notes (Signed)
CSN: [id number]     Arrival date & time 09/07/16  1659 History   None    Chief Complaint  Patient presents with  . SEXUALLY TRANSMITTED DISEASE   (Consider location/radiation/quality/duration/timing/severity/associated sxs/prior Treatment) Patient c/o KNot on his penis for 2 weeks. He denies having sex.  He denies any urethritis or urethral DC.  He has been wearing some tighter under garments and reports that it has irritated the penis.  He is back to wearing looser underwear.   The history is provided by the patient.  Penile Discharge  This is a new problem. The problem occurs constantly. The problem has not changed since onset.Nothing aggravates the symptoms. Nothing relieves the symptoms.    Past Medical History:  Diagnosis Date  . Schizophrenia [facility](HCC)    Past Surgical History:  Procedure Laterality Date  . HYPOSPADIAS CORRECTION     Family History  Problem Relation Age of Onset  . Bipolar disorder Sister   . ADD / ADHD Brother   . Drug abuse Maternal Uncle   . Cancer Maternal Grandmother 2065       breast ca  . Stroke Neg Hx   . Heart disease Neg Hx   . Bipolar disorder Brother    Social History  Substance Use Topics  . Smoking status: Current Every Day Smoker    Packs/day: 0.50    Years: 6.00    Types: Cigarettes, Cigars  . Smokeless tobacco: Not on file  . Alcohol use No    Review of Systems  Constitutional: Negative.   HENT: Negative.   Eyes: Negative.   Respiratory: Negative.   Cardiovascular: Negative.   Gastrointestinal: Negative.   Endocrine: Negative.   Genitourinary: Positive for discharge.  Musculoskeletal: Negative.   Allergic/Immunologic: Negative.   Neurological: Negative.   Hematological: Negative.   Psychiatric/Behavioral: Negative.     Allergies  Ace inhibitors  Home Medications   Prior to Admission medications   Medication Sig Start Date End Date Taking? Authorizing Provider  benztropine (COGENTIN) 0.5 MG tablet Take 1 tablet (0.5  mg total) by mouth at bedtime. 04/29/14   Thermon Leylandavis, Laura A, NP  Paliperidone Palmitate (INVEGA SUSTENNA) 156 MG/ML SUSP Inject 1 mL (156 mg total) into the muscle once. 05/06/14   Thermon Leylandavis, Laura A, NP   Meds Ordered and Administered this Visit  Medications - No data to display  BP 111/61 (BP Location: Right Arm)   Pulse (!) 59   Temp 98.9 F (37.2 C) (Oral)   Resp 18   SpO2 98%  No data found.   Physical Exam  Constitutional: He appears well-developed and well-nourished.  HENT:  Head: Normocephalic and atraumatic.  Eyes: Conjunctivae and EOM are normal. Pupils are equal, round, and reactive to light.  Neck: Normal range of motion. Neck supple.  Cardiovascular: Normal rate, regular rhythm and normal heart sounds.   Pulmonary/Chest: Effort normal and breath sounds normal.  Genitourinary:  Genitourinary Comments: Penis with hypospadias and right glans with swelling but no mass.  No urethral DC.  Nursing note and vitals reviewed.   Urgent Care Course     Procedures (including critical care time)  Labs Review Labs Reviewed - No data to display  Imaging Review No results found.   Visual Acuity Review  Right Eye Distance:   Left Eye Distance:   Bilateral Distance:    Right Eye Near:   Left Eye Near:    Bilateral Near:         MDM   1. Hypospadias,  unspecified hypospadias type    Patient is reassured.  He was wearing more restrictive underwear which could account for some swelling on the glans.    He is referred to Urology for Evaluation of Hypospadias. He has never seen a Insurance underwriter.  Patient refuses Tx or testing for STD.       Deatra Canter, [place] 09/07/16 1746

## 2016-09-07 NOTE — ED Notes (Signed)
Dirty Urine collected  

## 2016-09-07 NOTE — ED Triage Notes (Signed)
[facility] a [facility] a "knot" on his penis x 2 weeks.

## 2017-01-30 ENCOUNTER — Encounter (HOSPITAL_COMMUNITY): Payer: Self-pay | Admitting: Emergency Medicine

## 2017-01-30 ENCOUNTER — Ambulatory Visit (HOSPITAL_COMMUNITY)
Admission: EM | Admit: 2017-01-30 | Discharge: 2017-01-30 | Disposition: A | Discharge status: Home / Self Care | Payer: Medicaid Other | Attending: Family Medicine | Admitting: Family Medicine

## 2017-01-30 DIAGNOSIS — H6123 Impacted cerumen, bilateral: Secondary | ICD-10-CM | POA: Diagnosis not present

## 2017-01-30 NOTE — ED Triage Notes (Signed)
Pt here for left ear being clogged x 3 days

## 2017-01-30 NOTE — ED Provider Notes (Signed)
[facility]   [id number] 01/30/17 Arrival Time: 1214   SUBJECTIVE:  Thomas Murphy is a 26 y.o. male who presents to the urgent care with complaint of left ear being clogged x 3 days.  He has some minor discomfort in the right ear.  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 Schizophrenia (HCC)    Family History  Problem Relation Age of Onset  . Bipolar disorder Sister   . ADD / ADHD Brother   . Drug abuse Maternal Uncle   . Cancer Maternal Grandmother 34       breast ca  . Stroke Neg Hx   . Heart disease Neg Hx   . Bipolar disorder Brother    Social History   Social History  . Marital status: Single    Spouse name: N/A  . Number of children: N/A  . Years of education: N/A   Occupational History  . unemployed    Social History Main Topics  . Smoking status: Current Every Day Smoker    Packs/day: 0.50    Years: 6.00    Types: Cigarettes, Cigars  . Smokeless tobacco: Not on file  . Alcohol use No  . Drug use: Yes    Types: Marijuana  . Sexual activity: Not Currently   Other Topics Concern  . Not on file   Social History Narrative   Lives at home with mom.  Got through about one semester of college but had difficulties due to schizophrenia.   Community watch is his hobby   No outpatient prescriptions have been marked as taking for the 01/30/17 encounter [facility] Encounter).   Allergies  Allergen Reactions  . Ace Inhibitors Other (See Comments)    Reaction unknown      ROS: As per HPI, remainder of ROS negative.   OBJECTIVE:   Vitals:   01/30/17 1236  BP: 113/71  Pulse: (!) 54  Resp: 18  Temp: 98.2 F (36.8 C)  TempSrc: Oral  SpO2: 100%     General appearance: alert; no distress Eyes: PERRL; EOMI; conjunctiva normal HENT: normocephalic; atraumatic;  canals show bilateral cerumen impaction - resolved after irrigation, external ears normal without  trauma; nasal mucosa normal; oral mucosa normal Neck: supple Skin: warm and dry Neurologic: normal gait; grossly normal Psychological: alert and cooperative; normal mood and affect      Labs:  Results for orders placed or performed during the hospital encounter of 04/24/14  CBC with Differential  Result Value Ref Range   WBC 9.4 4.0 - 10.5 K/uL   RBC 4.29 4.22 - 5.81 MIL/uL   Hemoglobin 14.2 13.0 - 17.0 g/dL   HCT 40.9 81.1 - 91.4 %   MCV 97.7 78.0 - 100.0 fL   MCH 33.1 26.0 - 34.0 pg   MCHC 33.9 30.0 - 36.0 g/dL   RDW 78.2 95.6 - 21.3 %   Platelets 215 150 - 400 K/uL   Neutrophils Relative % 55 43 - 77 %   Neutro Abs 5.1 1.7 - 7.7 K/uL   Lymphocytes Relative 29 12 - 46 %   Lymphs Abs 2.7 0.7 - 4.0 K/uL   Monocytes Relative 14 (H) 3 - 12 %   Monocytes Absolute 1.3 (H) 0.1 - 1.0 K/uL   Eosinophils Relative 2 0 - 5 %   Eosinophils Absolute 0.1 0.0 - 0.7 K/uL   Basophils Relative 0 0 - 1 %  Basophils Absolute 0.0 0.0 - 0.1 K/uL  Comprehensive metabolic panel  Result Value Ref Range   Sodium 137 135 - 145 mmol/L   Potassium 3.8 3.5 - 5.1 mmol/L   Chloride 103 96 - 112 mEq/L   CO2 25 19 - 32 mmol/L   Glucose, Bld 90 70 - 99 mg/dL   BUN 11 6 - 23 mg/dL   Creatinine, Ser 0.98 0.50 - 1.35 mg/dL   Calcium 9.4 8.4 - 11.9 mg/dL   Total Protein 7.5 6.0 - 8.3 g/dL   Albumin 4.3 3.5 - 5.2 g/dL   AST 21 0 - 37 U/L   ALT 16 0 - 53 U/L   Alkaline Phosphatase 60 39 - 117 U/L   Total Bilirubin 1.1 0.3 - 1.2 mg/dL   GFR calc non Af Amer >90 >90 mL/min   GFR calc Af Amer >90 >90 mL/min   Anion gap 9 5 - 15  Ethanol  Result Value Ref Range   Alcohol, Ethyl (B) <5 0 - 9 mg/dL  Acetaminophen level  Result Value Ref Range   Acetaminophen (Tylenol), Serum <10.0 (L) 10 - 30 ug/mL  Salicylate level  Result Value Ref Range   Salicylate Lvl <4.0 2.8 - 20.0 mg/dL    Labs Reviewed - No data to display  No results found.     ASSESSMENT & PLAN:  1. Bilateral impacted cerumen        Reviewed expectations re: course of current medical issues. Questions answered. Outlined signs and symptoms indicating need for more acute intervention. Patient verbalized understanding. After Visit Summary given.    Procedures:      Elvina Sidle, MD 01/30/17 1322

## 2022-03-27 ENCOUNTER — Encounter: Payer: Self-pay | Admitting: Emergency Medicine

## 2022-03-27 ENCOUNTER — Ambulatory Visit
Admission: EM | Admit: 2022-03-27 | Discharge: 2022-03-27 | Disposition: A | Discharge status: Home / Self Care | Payer: Medicaid Other | Attending: Physician Assistant | Admitting: Physician Assistant

## 2022-03-27 ENCOUNTER — Other Ambulatory Visit: Payer: Self-pay

## 2022-03-27 DIAGNOSIS — Z113 Encounter for screening for infections with a predominantly sexual mode of transmission: Secondary | ICD-10-CM | POA: Diagnosis not present

## 2022-03-27 DIAGNOSIS — R369 Urethral discharge, unspecified: Secondary | ICD-10-CM | POA: Diagnosis not present

## 2022-03-27 NOTE — ED Triage Notes (Signed)
Pt here for penile discharge noticed this am; pt also c/o white spot on his tongue

## 2022-03-27 NOTE — ED Provider Notes (Signed)
[facility]    CSN: [id number] Arrival date & time: 03/27/22  0834      History   Chief Complaint Chief Complaint  Patient presents with   Penile Discharge    HPI Thomas Murphy is a 31 y.o. male.   Patient here today for evaluation of discharge that he first noticed this morning.  He states that he would like STD screening.  He denies any abdominal pain, back pain, dysuria.  He has not taken any medication for symptoms.  He does report a small white spot on his tongue that he also noticed today.  He does not report any pain associated with same.  The history is provided by the patient.  Penile Discharge    Past Medical History:  Diagnosis Date   Schizophrenia [facility])     Patient Active Problem List   Diagnosis Date Noted   Paranoid schizophrenia (HCC) 04/25/2014   Conjunctivitis 12/18/2010   Penile discharge 12/14/2010   Schizophrenia (HCC) 10/02/2010   Psychosis (HCC) 10/02/2010   TOBACCO USER 06/28/2007    Past Surgical History:  Procedure Laterality Date   HYPOSPADIAS CORRECTION         Home Medications    Prior to Admission medications   Medication Sig Start Date End Date Taking? Authorizing Provider  benztropine (COGENTIN) 0.5 MG tablet Take 1 tablet (0.5 mg total) by mouth at bedtime. 04/29/14   Thermon Leyland, NP  Paliperidone Palmitate (INVEGA SUSTENNA) 156 MG/ML SUSP Inject 1 mL (156 mg total) into the muscle once. 05/06/14   Thermon Leyland, NP    Family History Family History  Problem Relation Age of Onset   Bipolar disorder Sister    ADD / ADHD Brother    Drug abuse Maternal Uncle    Cancer Maternal Grandmother 54       breast ca   Stroke Neg Hx    Heart disease Neg Hx    Bipolar disorder Brother     Social History Social History   Tobacco Use   Smoking status: Every Day    Packs/day: 0.50    Years: 6.00    Total pack years: 3.00    Types: Cigarettes, Cigars  Substance Use Topics   Alcohol use: No     Alcohol/week: 1.0 standard drink of alcohol    Types: 1 Standard drinks or equivalent per week   Drug use: Yes    Types: Marijuana     Allergies   Ace inhibitors   Review of Systems Review of Systems  Constitutional:  Negative for chills and fever.  Eyes:  Negative for discharge and redness.  Genitourinary:  Positive for penile discharge. Negative for dysuria.  Neurological:  Negative for numbness.     Physical Exam Triage Vital Signs ED Triage Vitals  Enc Vitals Group     BP 03/27/22 1048 123/71     Pulse Rate 03/27/22 1048 69     Resp 03/27/22 1048 18     Temp 03/27/22 1048 97.9 F (36.6 C)     Temp Source 03/27/22 1048 Oral     SpO2 03/27/22 1048 96 %     Weight --      Height --      Head Circumference --      Peak Flow --      Pain Score 03/27/22 1050 0     Pain Loc --      Pain Edu? --      Excl. in GC? --  No data found.  Updated Vital Signs BP 123/71 (BP Location: Left Arm)   Pulse 69   Temp 97.9 F (36.6 C) (Oral)   Resp 18   SpO2 96%      Physical Exam Vitals and nursing note reviewed.  Constitutional:      General: He is not in acute distress.    Appearance: Normal appearance. He is not ill-appearing.  HENT:     Head: Normocephalic and atraumatic.  Eyes:     Conjunctiva/sclera: Conjunctivae normal.  Cardiovascular:     Rate and Rhythm: Normal rate.  Pulmonary:     Effort: Pulmonary effort is normal.  Neurological:     Mental Status: He is alert.  Psychiatric:        Mood and Affect: Mood normal.        Behavior: Behavior normal.        Thought Content: Thought content normal.      UC Treatments / Results  Labs (all labs ordered are listed, but only abnormal results are displayed) Labs Reviewed  HIV ANTIBODY (ROUTINE TESTING W REFLEX)  HEPATITIS PANEL, ACUTE  RPR  CYTOLOGY, (ORAL, ANAL, URETHRAL) ANCILLARY ONLY    EKG   Radiology No results found.  Procedures Procedures (including critical care time)  Medications  Ordered in UC Medications - No data to display  Initial Impression / Assessment and Plan / UC Course  I have reviewed the triage vital signs and the nursing notes.  Pertinent labs & imaging results that were available during my care of the patient were reviewed by me and considered in my medical decision making (see chart for details).    Will order STD screening.  Blood work also performed today for HIV, hepatitis and syphilis screening.  Will await results further recommendation.  Advised to abstain from sexual activity while awaiting results.  Final Clinical Impressions(s) / UC Diagnoses   Final diagnoses:  Penile discharge  Screening for STD (sexually transmitted disease)   Discharge Instructions   None    ED Prescriptions   None    PDMP not reviewed this encounter.   Tomi Bamberger, PA-C 03/27/22 1236

## 2022-03-28 LAB — HIV ANTIBODY (ROUTINE TESTING W REFLEX): HIV Screen 4th Generation wRfx: NONREACTIVE

## 2022-03-28 LAB — SYPHILIS: RPR W/REFLEX TO RPR TITER AND TREPONEMAL ANTIBODIES, TRADITIONAL SCREENING AND DIAGNOSIS ALGORITHM: RPR Ser Ql: NONREACTIVE

## 2022-03-29 LAB — CYTOLOGY, (ORAL, ANAL, URETHRAL) ANCILLARY ONLY
Chlamydia: NEGATIVE
Comment: NEGATIVE
Comment: NEGATIVE
Comment: NORMAL
Neisseria Gonorrhea: NEGATIVE
Trichomonas: NEGATIVE

## 2023-11-12 ENCOUNTER — Encounter: Payer: Self-pay | Admitting: Emergency Medicine

## 2023-11-12 ENCOUNTER — Ambulatory Visit
Admission: EM | Admit: 2023-11-12 | Discharge: 2023-11-12 | Disposition: A | Discharge status: Home / Self Care | Payer: MEDICAID | Attending: Emergency Medicine | Admitting: Emergency Medicine

## 2023-11-12 DIAGNOSIS — R0789 Other chest pain: Secondary | ICD-10-CM

## 2023-11-12 MED ORDER — CYCLOBENZAPRINE HCL 10 MG PO TABS: 10.0000 mg | ORAL_TABLET | Freq: Every evening | ORAL | 0 refills | Status: AC

## 2023-11-12 MED ORDER — IBUPROFEN 600 MG PO TABS: 600.0000 mg | ORAL_TABLET | Freq: Four times a day (QID) | ORAL | 0 refills | Status: AC | PRN

## 2023-11-12 NOTE — ED Provider Notes (Signed)
 [facility]    CSN: [id number] Arrival date & time: 11/12/23  1150      History   Chief Complaint Chief Complaint  Patient presents with   Chest Injury    HPI Thomas Murphy is a 33 y.o. male.  Was cutting a tree branch 4 days ago, and it fell towards him Hit the right side of chest He did not fall, and did not have pain at first  The right chest area hurts if he puts pressure on it or with certain movements He is not having any shortness of breath or wheezing  Took aleve a few times that helped   Past Medical History:  Diagnosis Date   Schizophrenia [facility])     Patient Active Problem List   Diagnosis Date Noted   Paranoid schizophrenia (HCC) 04/25/2014   Conjunctivitis 12/18/2010   Penile discharge 12/14/2010   Schizophrenia (HCC) 10/02/2010   Psychosis (HCC) 10/02/2010   TOBACCO USER 06/28/2007    Past Surgical History:  Procedure Laterality Date   HYPOSPADIAS CORRECTION         Home Medications    Prior to Admission medications   Medication Sig Start Date End Date Taking? Authorizing Provider  cyclobenzaprine  (FLEXERIL ) 10 MG tablet Take 1 tablet (10 mg total) by mouth at bedtime. 11/12/23  Yes Tiffney Haughton, [place], PA-C  ibuprofen  (ADVIL ) 600 MG tablet Take 1 tablet (600 mg total) by mouth every 6 (six) hours as needed. 11/12/23  Yes Aqueelah Cotrell, [place], PA-C  benztropine  (COGENTIN ) 0.5 MG tablet Take 1 tablet (0.5 mg total) by mouth at bedtime. 04/29/14   Nicholaus Leita LABOR, NP  Paliperidone  Palmitate (INVEGA  SUSTENNA) 156 MG/ML SUSP Inject 1 mL (156 mg total) into the muscle once. 05/06/14   Nicholaus Leita LABOR, NP    Family History Family History  Problem Relation Age of Onset   Bipolar disorder Sister    ADD / ADHD Brother    Drug abuse Maternal Uncle    Cancer Maternal Grandmother 4       breast ca   Stroke Neg Hx    Heart disease Neg Hx    Bipolar disorder Brother     Social History Social History   Tobacco Use   Smoking status: Every Day     Current packs/day: 0.50    Average packs/day: 0.5 packs/day for 6.0 years (3.0 ttl pk-yrs)    Types: Cigarettes, Cigars    Passive exposure: Current  Vaping Use   Vaping status: Never Used  Substance Use Topics   Alcohol use: No    Alcohol/week: 1.0 standard drink of alcohol    Types: 1 Standard drinks or equivalent per week   Drug use: Yes    Types: Marijuana     Allergies   Ace inhibitors   Review of Systems Review of Systems As per HPI  Physical Exam Triage Vital Signs ED Triage Vitals  Encounter Vitals Group     BP 11/12/23 1217 100/61     Girls Systolic BP Percentile --      Girls Diastolic BP Percentile --      Boys Systolic BP Percentile --      Boys Diastolic BP Percentile --      Pulse Rate 11/12/23 1217 63     Resp 11/12/23 1217 16     Temp 11/12/23 1217 97.9 F (36.6 C)     Temp Source 11/12/23 1217 Oral     SpO2 11/12/23 1217 98 %     Weight  11/12/23 1216 160 lb (72.6 kg)     Height --      Head Circumference --      Peak Flow --      Pain Score 11/12/23 1215 8     Pain Loc --      Pain Education --      Exclude from Growth Chart --    No data found.  Updated Vital Signs BP 100/61 (BP Location: Right Arm)   Pulse 63   Temp 97.9 F (36.6 C) (Oral)   Resp 16   Wt 160 lb (72.6 kg)   SpO2 98%   BMI 22.96 kg/m   Physical Exam Vitals and nursing note reviewed.  Constitutional:      General: He is not in acute distress. HENT:     Mouth/Throat:     Pharynx: Oropharynx is clear.  Cardiovascular:     Rate and Rhythm: Normal rate and regular rhythm.     Pulses: Normal pulses.     Heart sounds: Normal heart sounds.  Pulmonary:     Effort: Pulmonary effort is normal. No respiratory distress.     Breath sounds: Normal breath sounds. No wheezing.     Comments: Lung sounds clear throughout  Chest:     Chest wall: Tenderness present.       Comments: Muscular tenderness of right chest wall. No obvious bruising or swelling in this area.  Sternum non tender  Skin:    General: Skin is warm and dry.     Capillary Refill: Capillary refill takes less than 2 seconds.     Findings: No bruising, laceration or wound.  Neurological:     Mental Status: He is alert and oriented to person, place, and time.     UC Treatments / Results  Labs (all labs ordered are listed, but only abnormal results are displayed) Labs Reviewed - No data to display  EKG  Radiology No results found.  Procedures Procedures (including critical care time)  Medications Ordered in UC Medications - No data to display  Initial Impression / Assessment and Plan / UC Course  I have reviewed the triage vital signs and the nursing notes.  Pertinent labs & imaging results that were available during my care of the patient were reviewed by me and considered in my medical decision making (see chart for details).  Muscular tenderness of chest wall Clear lungs Stable vitals  Discussed option of chest xray today. Patient declines at this time. Recommend try ibuprofen  instead of aleve, flexeril , hot pad or ice Advised return if symptoms not improving in next several days Agrees to plan, no questions   Final Clinical Impressions(s) / UC Diagnoses   Final diagnoses:  Chest wall pain     Discharge Instructions      Ibuprofen  -- 1 tablet every 6 hours for pain Do not use any other NSAIDs while taking ibuprofen . This includes Aleve, naproxen, motrin , advil , etc  You can take the muscle relaxer Flexeril  twice daily, or just at bed time if it makes you drowsy  Apply ice or heating pad to the area several times daily  If 4-5 days of these treatments have not improved pain, please return     ED Prescriptions     Medication Sig Dispense Auth. Provider   ibuprofen  (ADVIL ) 600 MG tablet Take 1 tablet (600 mg total) by mouth every 6 (six) hours as needed. 30 tablet Jiovanna Frei, PA-C   cyclobenzaprine  (FLEXERIL ) 10 MG tablet Take 1 tablet (  10 mg  total) by mouth at bedtime. 20 tablet Nyajah Hyson, [place]berry, PA-C      PDMP not reviewed this encounter.   Iyanla Eilers, [place], PA-C 11/12/23 1318

## 2023-11-12 NOTE — Discharge Instructions (Addendum)
 Ibuprofen  -- 1 tablet every 6 hours for pain Do not use any other NSAIDs while taking ibuprofen . This includes Aleve, naproxen, motrin , advil , etc  You can take the muscle relaxer Flexeril  twice daily, or just at bed time if it makes you drowsy  Apply ice or heating pad to the area several times daily  If 4-5 days of these treatments have not improved pain, please return

## 2023-11-12 NOTE — ED Triage Notes (Signed)
 Pt presents c/o chest pain due to injury x 4 days. Pt says he was cutting down a tree and a small tree branch fell on him/his chest.

## 2024-03-28 ENCOUNTER — Encounter: Payer: Self-pay | Admitting: Family

## 2024-03-28 ENCOUNTER — Ambulatory Visit (INDEPENDENT_AMBULATORY_CARE_PROVIDER_SITE_OTHER): Payer: MEDICAID | Admitting: Family

## 2024-03-28 VITALS — BP 114/68 | HR 62 | Temp 98.2°F | Resp 16 | Ht 69.0 in | Wt 159.6 lb

## 2024-03-28 DIAGNOSIS — Z13220 Encounter for screening for lipoid disorders: Secondary | ICD-10-CM

## 2024-03-28 DIAGNOSIS — Z13228 Encounter for screening for other metabolic disorders: Secondary | ICD-10-CM

## 2024-03-28 DIAGNOSIS — Z0000 Encounter for general adult medical examination without abnormal findings: Secondary | ICD-10-CM | POA: Diagnosis not present

## 2024-03-28 DIAGNOSIS — Z1329 Encounter for screening for other suspected endocrine disorder: Secondary | ICD-10-CM

## 2024-03-28 DIAGNOSIS — Z1159 Encounter for screening for other viral diseases: Secondary | ICD-10-CM

## 2024-03-28 DIAGNOSIS — Z131 Encounter for screening for diabetes mellitus: Secondary | ICD-10-CM | POA: Diagnosis not present

## 2024-03-28 DIAGNOSIS — Z130 Encounter for screening for diseases of the blood and blood-forming organs and certain disorders involving the immune mechanism: Secondary | ICD-10-CM | POA: Diagnosis not present

## 2024-03-28 DIAGNOSIS — Z7689 Persons encountering health services in other specified circumstances: Secondary | ICD-10-CM

## 2024-03-28 NOTE — Progress Notes (Signed)
 Subjective:    Thomas Murphy - 33 y.o. male MRN [id number]  Date of birth: 1990-07-15  HPI  Thomas Murphy is to establish care and annual physical exam.   Current issues and/or concerns: - Established with Psychiatry. He denies thoughts of self-harm, suicidal ideations, homicidal ideations.   ROS per HPI    Health Maintenance:  Health Maintenance Due  Topic Date Due   Hepatitis C Screening  Never done     Past Medical History: Patient Active Problem List   Diagnosis Date Noted   Paranoid schizophrenia (HCC) 04/25/2014   Conjunctivitis 12/18/2010   Penile discharge 12/14/2010   Schizophrenia (HCC) 10/02/2010   Psychosis (HCC) 10/02/2010   TOBACCO USER 06/28/2007      Social History   reports that he has been smoking cigarettes and cigars. He has a 3 pack-year smoking history. He has been exposed to tobacco smoke. He does not have any smokeless tobacco history on file. He reports current drug use. Drug: Marijuana. He reports that he does not drink alcohol.   Family History  family history includes ADD / ADHD in his brother; Bipolar disorder in his brother and sister; Cancer (age of onset: 48) in his maternal grandmother; Drug abuse in his maternal uncle.   Medications: reviewed and updated   Objective:   Physical Exam BP 114/68   Pulse 62   Temp 98.2 F (36.8 C) (Oral)   Resp 16   Ht 5' 9 (1.753 m)   Wt 159 lb 9.6 oz (72.4 kg)   SpO2 96%   BMI 23.57 kg/m   Physical Exam HENT:     Head: Normocephalic and atraumatic.     Right Ear: Tympanic membrane, ear canal and external ear normal.     Left Ear: Tympanic membrane, ear canal and external ear normal.     Nose: Nose normal.     Mouth/Throat:     Mouth: Mucous membranes are moist.     Pharynx: Oropharynx is clear.  Eyes:     Extraocular Movements: Extraocular movements intact.     Conjunctiva/sclera: Conjunctivae normal.     Pupils: Pupils are equal, round, and reactive to light.  Neck:      Thyroid: No thyroid mass, thyromegaly or thyroid tenderness.  Cardiovascular:     Rate and Rhythm: Normal rate and regular rhythm.     Pulses: Normal pulses.     Heart sounds: Normal heart sounds.  Pulmonary:     Effort: Pulmonary effort is normal.     Breath sounds: Normal breath sounds.  Abdominal:     General: Bowel sounds are normal.     Palpations: Abdomen is soft.  Genitourinary:    Comments: Patient declined. Musculoskeletal:        General: Normal range of motion.     Right shoulder: Normal.     Left shoulder: Normal.     Right upper arm: Normal.     Left upper arm: Normal.     Right elbow: Normal.     Left elbow: Normal.     Right forearm: Normal.     Left forearm: Normal.     Right wrist: Normal.     Left wrist: Normal.     Right hand: Normal.     Left hand: Normal.     Cervical back: Normal, normal range of motion and neck supple.     Thoracic back: Normal.     Lumbar back: Normal.     Right hip: Normal.  Left hip: Normal.     Right upper leg: Normal.     Left upper leg: Normal.     Right knee: Normal.     Left knee: Normal.     Right lower leg: Normal.     Left lower leg: Normal.     Right ankle: Normal.     Left ankle: Normal.     Right foot: Normal.     Left foot: Normal.  Skin:    General: Skin is warm and dry.     Capillary Refill: Capillary refill takes less than 2 seconds.  Neurological:     General: No focal deficit present.     Mental Status: He is alert and oriented to person, place, and time.  Psychiatric:        Mood and Affect: Mood normal.        Behavior: Behavior normal.       Assessment & Plan:  1. Encounter to establish care (Primary) 2. Annual physical exam - Counseled on 150 minutes of exercise per week as tolerated, healthy eating (including decreased daily intake of saturated fats, cholesterol, added sugars, sodium), STI prevention, and routine healthcare maintenance.  3. Screening for metabolic disorder - Routine  screening.  - CMP14+EGFR  4. Screening for deficiency anemia - Routine screening.  - CBC  5. Diabetes mellitus screening - Routine screening.  - Hemoglobin A1c  6. Screening cholesterol level - Routine screening.  - Lipid panel  7. Thyroid disorder screen - Routine screening.  - TSH  8. Need for hepatitis C screening test - Routine screening.  - Hepatitis C Antibody   Patient was given clear instructions to go to Emergency Department or return to medical center if symptoms don't improve, worsen, or new problems develop.The patient verbalized understanding.  I discussed the assessment and treatment plan with the patient. The patient was provided an opportunity to ask questions and all were answered. The patient agreed with the plan and demonstrated an understanding of the instructions.   The patient was advised to call back or seek an in-person evaluation if the symptoms worsen or if the condition fails to improve as anticipated.    Greig Chute, NP 03/28/2024, 8:32 AM Primary Care at [facility]

## 2024-03-28 NOTE — Progress Notes (Signed)
 Establish care patient wants a physical done

## 2024-03-29 ENCOUNTER — Ambulatory Visit: Payer: Self-pay | Admitting: Family

## 2024-03-29 DIAGNOSIS — Z13220 Encounter for screening for lipoid disorders: Secondary | ICD-10-CM

## 2024-03-29 LAB — CMP14+EGFR
ALT: 14 IU/L (ref 0–44)
AST: 22 IU/L (ref 0–40)
Albumin: 5 g/dL (ref 4.1–5.1)
Alkaline Phosphatase: 72 IU/L (ref 47–123)
BUN/Creatinine Ratio: 16 (ref 9–20)
BUN: 16 mg/dL (ref 6–20)
Bilirubin Total: 0.6 mg/dL (ref 0.0–1.2)
CO2: 23 mmol/L (ref 20–29)
Calcium: 10.1 mg/dL (ref 8.7–10.2)
Chloride: 101 mmol/L (ref 96–106)
Creatinine, Ser: 0.99 mg/dL (ref 0.76–1.27)
Globulin, Total: 3.1 g/dL (ref 1.5–4.5)
Glucose: 95 mg/dL (ref 70–99)
Potassium: 4.4 mmol/L (ref 3.5–5.2)
Sodium: 141 mmol/L (ref 134–144)
Total Protein: 8.1 g/dL (ref 6.0–8.5)
eGFR: 103 mL/min/1.73 (ref >59)

## 2024-03-29 LAB — LIPID PANEL
Chol/HDL Ratio: 3.3 ratio (ref 0.0–5.0)
Cholesterol, Total: 197 mg/dL (ref 100–199)
HDL: 60 mg/dL (ref >39)
LDL Chol Calc (NIH): 118 mg/dL — ABNORMAL HIGH (ref 0–99)
Triglycerides: 106 mg/dL (ref 0–149)
VLDL Cholesterol Cal: 19 mg/dL (ref 5–40)

## 2024-03-29 LAB — CBC
Hematocrit: 43.3 % (ref 37.5–51.0)
Hemoglobin: 14.1 g/dL (ref 13.0–17.7)
MCH: 33.7 pg — ABNORMAL HIGH (ref 26.6–33.0)
MCHC: 32.6 g/dL (ref 31.5–35.7)
MCV: 103 fL — ABNORMAL HIGH (ref 79–97)
Platelets: 192 x10E3/uL (ref 150–450)
RBC: 4.19 x10E6/uL (ref 4.14–5.80)
RDW: 11 % — ABNORMAL LOW (ref 11.6–15.4)
WBC: 7.9 x10E3/uL (ref 3.4–10.8)

## 2024-03-29 LAB — HEMOGLOBIN A1C
Est. average glucose Bld gHb Est-mCnc: 85 mg/dL
Hgb A1c MFr Bld: 4.6 % — ABNORMAL LOW (ref 4.8–5.6)

## 2024-03-29 LAB — TSH: TSH: 1.46 u[IU]/mL (ref 0.450–4.500)

## 2024-03-29 LAB — HEPATITIS C ANTIBODY: Hep C Virus Ab: NONREACTIVE

## 2025-03-29 ENCOUNTER — Encounter: Payer: MEDICAID | Admitting: Family Medicine
